# Patient Record
Sex: Female | Born: 1935 | Race: White | Hispanic: No | Smoking: Never smoker
Health system: Southern US, Community
[De-identification: ages and names within clinical notes are randomized; demographics above are authoritative.]

## PROBLEM LIST (undated history)

## (undated) DIAGNOSIS — M810 Age-related osteoporosis without current pathological fracture: Secondary | ICD-10-CM

## (undated) DIAGNOSIS — R32 Unspecified urinary incontinence: Secondary | ICD-10-CM

## (undated) DIAGNOSIS — I2699 Other pulmonary embolism without acute cor pulmonale: Secondary | ICD-10-CM

## (undated) DIAGNOSIS — K922 Gastrointestinal hemorrhage, unspecified: Secondary | ICD-10-CM

## (undated) DIAGNOSIS — D7289 Other specified disorders of white blood cells: Secondary | ICD-10-CM

## (undated) DIAGNOSIS — F0280 Dementia in other diseases classified elsewhere without behavioral disturbance: Secondary | ICD-10-CM

## (undated) DIAGNOSIS — R413 Other amnesia: Secondary | ICD-10-CM

## (undated) DIAGNOSIS — I509 Heart failure, unspecified: Secondary | ICD-10-CM

## (undated) DIAGNOSIS — E7800 Pure hypercholesterolemia, unspecified: Secondary | ICD-10-CM

## (undated) DIAGNOSIS — E875 Hyperkalemia: Secondary | ICD-10-CM

## (undated) DIAGNOSIS — F0390 Unspecified dementia without behavioral disturbance: Secondary | ICD-10-CM

## (undated) DIAGNOSIS — I82409 Acute embolism and thrombosis of unspecified deep veins of unspecified lower extremity: Secondary | ICD-10-CM

## (undated) DIAGNOSIS — M273 Alveolitis of jaws: Secondary | ICD-10-CM

## (undated) DIAGNOSIS — R350 Frequency of micturition: Secondary | ICD-10-CM

## (undated) DIAGNOSIS — L03039 Cellulitis of unspecified toe: Secondary | ICD-10-CM

## (undated) DIAGNOSIS — M1990 Unspecified osteoarthritis, unspecified site: Secondary | ICD-10-CM

## (undated) DIAGNOSIS — D473 Essential (hemorrhagic) thrombocythemia: Secondary | ICD-10-CM

## (undated) DIAGNOSIS — R269 Unspecified abnormalities of gait and mobility: Secondary | ICD-10-CM

## (undated) DIAGNOSIS — I10 Essential (primary) hypertension: Secondary | ICD-10-CM

## (undated) DIAGNOSIS — G309 Alzheimer's disease, unspecified: Secondary | ICD-10-CM

## (undated) DIAGNOSIS — R0602 Shortness of breath: Secondary | ICD-10-CM

## (undated) HISTORY — DX: Frequency of micturition: R35.0

## (undated) HISTORY — DX: Essential (hemorrhagic) thrombocythemia: D47.3

## (undated) HISTORY — DX: Alveolitis of jaws: M27.3

## (undated) HISTORY — DX: Other amnesia: R41.3

## (undated) HISTORY — DX: Alzheimer's disease, unspecified: G30.9

## (undated) HISTORY — DX: Dementia in other diseases classified elsewhere, unspecified severity, without behavioral disturbance, psychotic disturbance, mood disturbance, and anxiety: F02.80

## (undated) HISTORY — DX: Unspecified urinary incontinence: R32

## (undated) HISTORY — PX: APPENDECTOMY: SHX54

## (undated) HISTORY — DX: Unspecified osteoarthritis, unspecified site: M19.90

## (undated) HISTORY — DX: Heart failure, unspecified: I50.9

## (undated) HISTORY — DX: Cellulitis of unspecified toe: L03.039

## (undated) HISTORY — DX: Hyperkalemia: E87.5

## (undated) HISTORY — DX: Age-related osteoporosis without current pathological fracture: M81.0

## (undated) HISTORY — DX: Unspecified abnormalities of gait and mobility: R26.9

## (undated) HISTORY — DX: Other specified disorders of white blood cells: D72.89

## (undated) HISTORY — PX: CATARACT EXTRACTION W/ INTRAOCULAR LENS  IMPLANT, BILATERAL: SHX1307

## (undated) HISTORY — DX: Gastrointestinal hemorrhage, unspecified: K92.2

---

## 2006-02-17 ENCOUNTER — Emergency Department (HOSPITAL_COMMUNITY): Admission: EM | Admit: 2006-02-17 | Discharge: 2006-02-17 | Payer: Self-pay | Admitting: Emergency Medicine

## 2009-07-29 LAB — HM DEXA SCAN

## 2010-01-04 ENCOUNTER — Encounter: Admission: RE | Admit: 2010-01-04 | Discharge: 2010-01-04 | Payer: Self-pay | Admitting: Internal Medicine

## 2010-01-11 ENCOUNTER — Encounter: Admission: RE | Admit: 2010-01-11 | Discharge: 2010-01-11 | Payer: Self-pay | Admitting: Internal Medicine

## 2010-01-19 ENCOUNTER — Encounter (HOSPITAL_BASED_OUTPATIENT_CLINIC_OR_DEPARTMENT_OTHER): Admission: RE | Admit: 2010-01-19 | Discharge: 2010-03-15 | Payer: Self-pay | Admitting: Internal Medicine

## 2010-01-26 ENCOUNTER — Ambulatory Visit: Payer: Self-pay | Admitting: Vascular Surgery

## 2010-05-09 ENCOUNTER — Emergency Department (HOSPITAL_COMMUNITY): Admission: EM | Admit: 2010-05-09 | Discharge: 2010-05-09 | Payer: Self-pay | Admitting: Emergency Medicine

## 2010-09-13 LAB — URINALYSIS, ROUTINE W REFLEX MICROSCOPIC
Glucose, UA: NEGATIVE mg/dL
Hgb urine dipstick: NEGATIVE
Specific Gravity, Urine: 1.023 (ref 1.005–1.030)
Urobilinogen, UA: 0.2 mg/dL (ref 0.0–1.0)
pH: 5.5 (ref 5.0–8.0)

## 2010-09-13 LAB — DIFFERENTIAL
Basophils Relative: 0 % (ref 0–1)
Eosinophils Relative: 0 % (ref 0–5)
Lymphocytes Relative: 9 % — ABNORMAL LOW (ref 12–46)
Lymphs Abs: 1 10*3/uL (ref 0.7–4.0)
Monocytes Relative: 6 % (ref 3–12)
Neutro Abs: 9.3 10*3/uL — ABNORMAL HIGH (ref 1.7–7.7)

## 2010-09-13 LAB — BASIC METABOLIC PANEL
CO2: 28 mEq/L (ref 19–32)
Chloride: 103 mEq/L (ref 96–112)
GFR calc Af Amer: >60 mL/min (ref >60)
Glucose, Bld: 91 mg/dL (ref 70–99)
Sodium: 141 mEq/L (ref 135–145)

## 2010-09-13 LAB — CBC
Hemoglobin: 15.9 g/dL — ABNORMAL HIGH (ref 12.0–15.0)
MCH: 30.7 pg (ref 26.0–34.0)
MCHC: 33.3 g/dL (ref 30.0–36.0)
MCV: 92.2 fL (ref 78.0–100.0)
RBC: 5.16 MIL/uL — ABNORMAL HIGH (ref 3.87–5.11)

## 2010-11-15 NOTE — Procedures (Signed)
DUPLEX DEEP VENOUS EXAM - LOWER EXTREMITY   INDICATION:  Nonhealing wound.   HISTORY:  Edema:  At ankles.  Trauma/Surgery:  No.  Pain:  No.  PE:  No.  Previous DVT:  No.  Anticoagulants:  No.  Other:   DUPLEX EXAM:                CFV          SFV          PopV  PTV   GSV                R     L      R     L      R  L  R  L  R  L  Thrombosis    o     o      o     o      o  o  o  o  o  o  Spontaneous   +     +      +     +      +  +  +  +  +  +  Phasic        +     +      +     +      +  +  +  +  +  +  Augmentation  +     +      +     +      +  +  +  +  +  +  Compressible  +     +      +     +      +  +  +  +  +  +  Competent     Mildly d     Mildly d     Mildly d Mildly d + +     +  +             +     +   Legend:  + - yes  o - no  p - partial  D - decreased   IMPRESSION:  1. Bilateral lower extremities appear to be negative for deep venous      thrombosis and superficial phlebitis.  2. Bilateral lower extremities suggest mild reflux in the common      femoral vein and superficial femoral vein.  3. Bilateral greater saphenous veins appear to be negative for reflux.  4. Small pocket of fluid noted in the right popliteal fossa.  5. Somewhat limited due to patient's inability to follow instructions      completely.    _____________________________  Janetta Hora Fields, MD   NT/MEDQ  D:  01/26/2010  T:  01/26/2010  Job:  [id number]

## 2010-12-30 ENCOUNTER — Encounter: Payer: Self-pay | Admitting: Oncology

## 2012-12-01 DIAGNOSIS — I82409 Acute embolism and thrombosis of unspecified deep veins of unspecified lower extremity: Secondary | ICD-10-CM

## 2012-12-01 DIAGNOSIS — I2699 Other pulmonary embolism without acute cor pulmonale: Secondary | ICD-10-CM

## 2012-12-01 HISTORY — DX: Other pulmonary embolism without acute cor pulmonale: I26.99

## 2012-12-01 HISTORY — DX: Acute embolism and thrombosis of unspecified deep veins of unspecified lower extremity: I82.409

## 2012-12-04 ENCOUNTER — Emergency Department (HOSPITAL_COMMUNITY): Payer: Medicare Other

## 2012-12-04 ENCOUNTER — Inpatient Hospital Stay (HOSPITAL_COMMUNITY)
Admission: EM | Admit: 2012-12-04 | Discharge: 2012-12-07 | DRG: 176 | Disposition: A | Discharge status: Home Health | Payer: Medicare Other | Attending: Internal Medicine | Admitting: Internal Medicine

## 2012-12-04 ENCOUNTER — Encounter (HOSPITAL_COMMUNITY): Payer: Self-pay | Admitting: *Deleted

## 2012-12-04 DIAGNOSIS — F0390 Unspecified dementia without behavioral disturbance: Secondary | ICD-10-CM

## 2012-12-04 DIAGNOSIS — I2699 Other pulmonary embolism without acute cor pulmonale: Principal | ICD-10-CM

## 2012-12-04 DIAGNOSIS — IMO0002 Reserved for concepts with insufficient information to code with codable children: Secondary | ICD-10-CM

## 2012-12-04 DIAGNOSIS — R600 Localized edema: Secondary | ICD-10-CM

## 2012-12-04 DIAGNOSIS — G309 Alzheimer's disease, unspecified: Secondary | ICD-10-CM | POA: Diagnosis present

## 2012-12-04 DIAGNOSIS — I2789 Other specified pulmonary heart diseases: Secondary | ICD-10-CM | POA: Diagnosis present

## 2012-12-04 DIAGNOSIS — I824Z9 Acute embolism and thrombosis of unspecified deep veins of unspecified distal lower extremity: Secondary | ICD-10-CM | POA: Diagnosis present

## 2012-12-04 DIAGNOSIS — Z7982 Long term (current) use of aspirin: Secondary | ICD-10-CM

## 2012-12-04 DIAGNOSIS — Z7901 Long term (current) use of anticoagulants: Secondary | ICD-10-CM

## 2012-12-04 DIAGNOSIS — F0280 Dementia in other diseases classified elsewhere without behavioral disturbance: Secondary | ICD-10-CM | POA: Diagnosis present

## 2012-12-04 DIAGNOSIS — D72829 Elevated white blood cell count, unspecified: Secondary | ICD-10-CM

## 2012-12-04 DIAGNOSIS — Z823 Family history of stroke: Secondary | ICD-10-CM

## 2012-12-04 DIAGNOSIS — R7309 Other abnormal glucose: Secondary | ICD-10-CM | POA: Diagnosis present

## 2012-12-04 DIAGNOSIS — I359 Nonrheumatic aortic valve disorder, unspecified: Secondary | ICD-10-CM | POA: Diagnosis present

## 2012-12-04 DIAGNOSIS — I10 Essential (primary) hypertension: Secondary | ICD-10-CM

## 2012-12-04 DIAGNOSIS — E441 Mild protein-calorie malnutrition: Secondary | ICD-10-CM | POA: Diagnosis present

## 2012-12-04 DIAGNOSIS — L03119 Cellulitis of unspecified part of limb: Secondary | ICD-10-CM | POA: Diagnosis present

## 2012-12-04 DIAGNOSIS — Z66 Do not resuscitate: Secondary | ICD-10-CM | POA: Diagnosis present

## 2012-12-04 DIAGNOSIS — Z9089 Acquired absence of other organs: Secondary | ICD-10-CM

## 2012-12-04 DIAGNOSIS — R748 Abnormal levels of other serum enzymes: Secondary | ICD-10-CM

## 2012-12-04 DIAGNOSIS — Z79899 Other long term (current) drug therapy: Secondary | ICD-10-CM

## 2012-12-04 DIAGNOSIS — R55 Syncope and collapse: Secondary | ICD-10-CM

## 2012-12-04 DIAGNOSIS — E7800 Pure hypercholesterolemia, unspecified: Secondary | ICD-10-CM | POA: Diagnosis present

## 2012-12-04 DIAGNOSIS — L02419 Cutaneous abscess of limb, unspecified: Secondary | ICD-10-CM | POA: Diagnosis present

## 2012-12-04 HISTORY — DX: Unspecified dementia, unspecified severity, without behavioral disturbance, psychotic disturbance, mood disturbance, and anxiety: F03.90

## 2012-12-04 HISTORY — DX: Essential (primary) hypertension: I10

## 2012-12-04 HISTORY — DX: Pure hypercholesterolemia, unspecified: E78.00

## 2012-12-04 LAB — POCT I-STAT 3, ART BLOOD GAS (G3+)
Bicarbonate: 23.7 mEq/L (ref 20.0–24.0)
TCO2: 25 mmol/L (ref 0–100)

## 2012-12-04 LAB — CBC WITH DIFFERENTIAL/PLATELET
Basophils Relative: 0 % (ref 0–1)
Eosinophils Relative: 0 % (ref 0–5)
HCT: 45 % (ref 36.0–46.0)
Hemoglobin: 14.4 g/dL (ref 12.0–15.0)
Lymphs Abs: 0.8 10*3/uL (ref 0.7–4.0)
MCV: 80.2 fL (ref 78.0–100.0)
Monocytes Relative: 5 % (ref 3–12)
Platelets: 344 10*3/uL (ref 150–400)
RBC: 5.61 MIL/uL — ABNORMAL HIGH (ref 3.87–5.11)
WBC: 20.9 10*3/uL — ABNORMAL HIGH (ref 4.0–10.5)

## 2012-12-04 LAB — URINALYSIS, ROUTINE W REFLEX MICROSCOPIC
Glucose, UA: NEGATIVE mg/dL
Hgb urine dipstick: NEGATIVE
Ketones, ur: 15 mg/dL — AB
Protein, ur: 100 mg/dL — AB

## 2012-12-04 LAB — COMPREHENSIVE METABOLIC PANEL
AST: 45 U/L — ABNORMAL HIGH (ref 0–37)
Albumin: 3 g/dL — ABNORMAL LOW (ref 3.5–5.2)
Alkaline Phosphatase: 113 U/L (ref 39–117)
BUN: 27 mg/dL — ABNORMAL HIGH (ref 6–23)
Chloride: 102 mEq/L (ref 96–112)
Potassium: 4.3 mEq/L (ref 3.5–5.1)
Total Bilirubin: 0.6 mg/dL (ref 0.3–1.2)

## 2012-12-04 LAB — URINE MICROSCOPIC-ADD ON

## 2012-12-04 MED ORDER — SODIUM CHLORIDE 0.9 % IV BOLUS (SEPSIS)
1000.0000 mL | Freq: Once | INTRAVENOUS | Status: AC
  Administered 2012-12-04: 1000 mL via INTRAVENOUS

## 2012-12-04 MED ORDER — ASPIRIN 81 MG PO CHEW
324.0000 mg | CHEWABLE_TABLET | Freq: Once | ORAL | Status: AC
  Administered 2012-12-04: 324 mg via ORAL
  Filled 2012-12-04: qty 4

## 2012-12-04 MED ORDER — IOHEXOL 350 MG/ML SOLN
100.0000 mL | Freq: Once | INTRAVENOUS | Status: AC | PRN
  Administered 2012-12-04: 100 mL via INTRAVENOUS

## 2012-12-04 NOTE — ED Provider Notes (Signed)
History     CSN: [id number]  Arrival date & time 12/04/12  1955   First MD Initiated Contact with Patient 12/04/12 2001      No chief complaint on file.   (Consider location/radiation/quality/duration/timing/severity/associated sxs/prior treatment) HPI Comments: 79 y.o. female who presents to the Er after she had one episode of syncope at home. Per family, she was standing and then, "passed out" for a few seconds and then was back to baseline. Pt has significant alzheimer. Her daughter states she has noticed that her breathing has been slightly high as well.   Patient is a 79 y.o. female presenting with general illness. The history is provided by the EMS personnel and a caregiver. The history is limited by the condition of the patient.  Illness Severity:  Mild Onset quality:  Gradual Timing:  Constant Chronicity:  New Associated symptoms: no abdominal pain and no chest pain     No past medical history on file.  No past surgical history on file.  No family history on file.  History  Substance Use Topics  . Smoking status: Not on file  . Smokeless tobacco: Not on file  . Alcohol Use: Not on file    OB History   No data available      Review of Systems  Unable to perform ROS: Dementia  Cardiovascular: Negative for chest pain.  Gastrointestinal: Negative for abdominal pain.    Allergies  Review of patient's allergies indicates not on file.  Home Medications  No current outpatient prescriptions on file.  BP 116/61  Pulse 111  Temp(Src) 99.9 F (37.7 C) (Rectal)  Resp 27  SpO2 98%  Physical Exam  Constitutional: She appears well-developed and well-nourished. No distress.  HENT:  Head: Normocephalic.  Eyes: Pupils are equal, round, and reactive to light.  Neck: Normal range of motion. No tracheal deviation present. No thyromegaly present.  Cardiovascular:  Tachycardia   Pulmonary/Chest: She has no wheezes.  Slightly elevated RR  Abdominal: She exhibits  no distension. There is no tenderness.  Musculoskeletal: She exhibits no edema and no tenderness.  Neurological:  Pt is able to follow commands, but does not know person / place / time  Skin: Skin is warm.    ED Course  Procedures (including critical care time)  Labs Reviewed  CBC WITH DIFFERENTIAL - Abnormal; Notable for the following:    WBC 20.9 (*)    RBC 5.61 (*)    MCH 25.7 (*)    RDW 19.6 (*)    Neutrophils Relative % 91 (*)    Lymphocytes Relative 4 (*)    Neutro Abs 19.1 (*)    All other components within normal limits  COMPREHENSIVE METABOLIC PANEL - Abnormal; Notable for the following:    Glucose, Bld 198 (*)    BUN 27 (*)    Albumin 3.0 (*)    AST 45 (*)    GFR calc non Af Amer 55 (*)    GFR calc Af Amer 64 (*)    All other components within normal limits  URINALYSIS, ROUTINE W REFLEX MICROSCOPIC - Abnormal; Notable for the following:    Color, Urine AMBER (*)    Bilirubin Urine SMALL (*)    Ketones, ur 15 (*)    Protein, ur 100 (*)    Leukocytes, UA TRACE (*)    All other components within normal limits  URINE MICROSCOPIC-ADD ON - Abnormal; Notable for the following:    Casts HYALINE CASTS (*)    All  other components within normal limits  POCT I-STAT 3, BLOOD GAS (G3+) - Abnormal; Notable for the following:    pCO2 arterial 34.1 (*)    All other components within normal limits  POCT I-STAT TROPONIN I - Abnormal; Notable for the following:    Troponin i, poc 0.18 (*)    All other components within normal limits  CULTURE, BLOOD (ROUTINE X 2)  CULTURE, BLOOD (ROUTINE X 2)  BLOOD GAS, ARTERIAL  CBC WITH DIFFERENTIAL  TROPONIN I   Dg Chest 2 View  12/04/2012   *RADIOLOGY REPORT*  Clinical Data: Dizziness, pneumonia  CHEST - 2 VIEW  Comparison: 02/17/2006 chest radiograph, CT same date 02/17/2006  Findings: Mild cardiomegaly noted with chronically prominent interstitial markings but no evidence for alveolar edema or focal pulmonary opacity.  No pleural  effusion.  No acute osseous finding. Stable mid thoracic compression deformity at T8.  IMPRESSION: Cardiomegaly with stable prominent interstitial markings but no acute finding or significant interval change.   Original Report Authenticated By: Christiana Pellant, M.D.    ECG shows sinus tachycardia, HR 111, no inverted T waves. Left axis.   MDM  Pt with syncope episode at home. She is tachycardic, istat trop shows slight elevation, will make sure this is correct by repeating in lab. She has slightly elevated RR. And she is on 2L [place], her sats were in the high 80s initially. She has leukocytosis, but no clear signs of infection in urine or chest x-ray. Will get CT PE study to further evaluate for possible PE.   CTA and CT head pending currently -- hospitalist team is admitting pt. Cards fellow was called about slight elevation in trop, suspect demand related, and they state not to start heparin but to instead trend enzymes.   1. Syncope           Josephyne Tarter Donette Larry, MD 12/04/12 2336

## 2012-12-04 NOTE — ED Notes (Addendum)
Pt BIB [facility] for near syncopal episode, SOB for "a couple days" and edema in LLE. Pt coming from home. Before transport pt O2 88% RA. PT 97% on 4L en route. Pt ambulated with drop in O2 sats. Pt awake, alert, oriented per her norm. Respiration equal, unlabored.

## 2012-12-04 NOTE — ED Notes (Signed)
Family at bedside. 

## 2012-12-04 NOTE — ED Notes (Signed)
In and out cath performed.  Sterile technique used during procedure.  Urine amber and cloud with odor.  Patient tolerated well.

## 2012-12-05 ENCOUNTER — Inpatient Hospital Stay (HOSPITAL_COMMUNITY): Payer: Medicare Other

## 2012-12-05 ENCOUNTER — Encounter (HOSPITAL_COMMUNITY): Payer: Self-pay | Admitting: Internal Medicine

## 2012-12-05 DIAGNOSIS — I2699 Other pulmonary embolism without acute cor pulmonale: Secondary | ICD-10-CM

## 2012-12-05 DIAGNOSIS — I10 Essential (primary) hypertension: Secondary | ICD-10-CM

## 2012-12-05 DIAGNOSIS — R748 Abnormal levels of other serum enzymes: Secondary | ICD-10-CM | POA: Diagnosis present

## 2012-12-05 DIAGNOSIS — D72829 Elevated white blood cell count, unspecified: Secondary | ICD-10-CM

## 2012-12-05 DIAGNOSIS — R609 Edema, unspecified: Secondary | ICD-10-CM

## 2012-12-05 DIAGNOSIS — M7989 Other specified soft tissue disorders: Secondary | ICD-10-CM

## 2012-12-05 DIAGNOSIS — R55 Syncope and collapse: Secondary | ICD-10-CM

## 2012-12-05 DIAGNOSIS — R600 Localized edema: Secondary | ICD-10-CM | POA: Diagnosis present

## 2012-12-05 DIAGNOSIS — F0390 Unspecified dementia without behavioral disturbance: Secondary | ICD-10-CM | POA: Diagnosis present

## 2012-12-05 LAB — CBC WITH DIFFERENTIAL/PLATELET
Basophils Relative: 0 % (ref 0–1)
Eosinophils Absolute: 0 10*3/uL (ref 0.0–0.7)
HCT: 41.6 % (ref 36.0–46.0)
Hemoglobin: 13.3 g/dL (ref 12.0–15.0)
Lymphocytes Relative: 4 % — ABNORMAL LOW (ref 12–46)
Lymphs Abs: 0.7 10*3/uL (ref 0.7–4.0)
MCH: 25.4 pg — ABNORMAL LOW (ref 26.0–34.0)
MCHC: 32 g/dL (ref 30.0–36.0)
MCV: 79.4 fL (ref 78.0–100.0)
Monocytes Absolute: 1.1 10*3/uL — ABNORMAL HIGH (ref 0.1–1.0)
Neutro Abs: 16.8 10*3/uL — ABNORMAL HIGH (ref 1.7–7.7)
RDW: 19.6 % — ABNORMAL HIGH (ref 11.5–15.5)

## 2012-12-05 LAB — VALPROIC ACID LEVEL: Valproic Acid Lvl: <10 ug/mL — ABNORMAL LOW (ref 50.0–100.0)

## 2012-12-05 LAB — COMPREHENSIVE METABOLIC PANEL
ALT: 27 U/L (ref 0–35)
AST: 34 U/L (ref 0–37)
Albumin: 2.7 g/dL — ABNORMAL LOW (ref 3.5–5.2)
Alkaline Phosphatase: 97 U/L (ref 39–117)
Calcium: 8.8 mg/dL (ref 8.4–10.5)
Potassium: 4 mEq/L (ref 3.5–5.1)
Sodium: 142 mEq/L (ref 135–145)
Total Protein: 6.3 g/dL (ref 6.0–8.3)

## 2012-12-05 LAB — TROPONIN I
Troponin I: 0.46 ng/mL (ref <0.30)
Troponin I: 0.53 ng/mL (ref <0.30)

## 2012-12-05 LAB — HEPARIN LEVEL (UNFRACTIONATED): Heparin Unfractionated: <0.1 IU/mL — ABNORMAL LOW (ref 0.30–0.70)

## 2012-12-05 MED ORDER — DONEPEZIL HCL 10 MG PO TABS
10.0000 mg | ORAL_TABLET | Freq: Every day | ORAL | Status: DC
  Administered 2012-12-05 – 2012-12-06 (×2): 10 mg via ORAL
  Filled 2012-12-05 (×4): qty 1

## 2012-12-05 MED ORDER — ONDANSETRON HCL 4 MG PO TABS: 4.0000 mg | ORAL_TABLET | Freq: Four times a day (QID) | ORAL | Status: DC | PRN

## 2012-12-05 MED ORDER — HEPARIN (PORCINE) IN NACL 100-0.45 UNIT/ML-% IJ SOLN
1250.0000 [IU]/h | INTRAMUSCULAR | Status: AC
  Administered 2012-12-05: 1050 [IU]/h via INTRAVENOUS
  Administered 2012-12-05: 900 [IU]/h via INTRAVENOUS
  Administered 2012-12-05: 1050 [IU]/h via INTRAVENOUS
  Filled 2012-12-05 (×4): qty 250

## 2012-12-05 MED ORDER — ACETAMINOPHEN 650 MG RE SUPP: 650.0000 mg | Freq: Four times a day (QID) | RECTAL | Status: DC | PRN

## 2012-12-05 MED ORDER — SODIUM CHLORIDE 0.9 % IJ SOLN
3.0000 mL | Freq: Two times a day (BID) | INTRAMUSCULAR | Status: DC
  Administered 2012-12-05 – 2012-12-07 (×5): 3 mL via INTRAVENOUS

## 2012-12-05 MED ORDER — MEMANTINE HCL 10 MG PO TABS
10.0000 mg | ORAL_TABLET | Freq: Two times a day (BID) | ORAL | Status: DC
  Administered 2012-12-05 – 2012-12-07 (×5): 10 mg via ORAL
  Filled 2012-12-05 (×6): qty 1

## 2012-12-05 MED ORDER — ENSURE COMPLETE PO LIQD: 237.0000 mL | Freq: Every day | ORAL | Status: DC | PRN

## 2012-12-05 MED ORDER — AMLODIPINE BESYLATE 5 MG PO TABS
5.0000 mg | ORAL_TABLET | Freq: Every day | ORAL | Status: DC
  Administered 2012-12-05 – 2012-12-07 (×3): 5 mg via ORAL
  Filled 2012-12-05 (×3): qty 1

## 2012-12-05 MED ORDER — VANCOMYCIN HCL IN DEXTROSE 750-5 MG/150ML-% IV SOLN: 750.0000 mg | INTRAVENOUS | Status: DC

## 2012-12-05 MED ORDER — ACETAMINOPHEN 325 MG PO TABS: 650.0000 mg | ORAL_TABLET | Freq: Four times a day (QID) | ORAL | Status: DC | PRN

## 2012-12-05 MED ORDER — HEPARIN BOLUS VIA INFUSION
2000.0000 [IU] | Freq: Once | INTRAVENOUS | Status: AC
  Administered 2012-12-05: 2000 [IU] via INTRAVENOUS
  Filled 2012-12-05: qty 2000

## 2012-12-05 MED ORDER — SODIUM CHLORIDE 0.9 % IV SOLN
INTRAVENOUS | Status: AC
  Administered 2012-12-05 (×2): via INTRAVENOUS

## 2012-12-05 MED ORDER — ASPIRIN EC 325 MG PO TBEC
325.0000 mg | DELAYED_RELEASE_TABLET | Freq: Every day | ORAL | Status: DC
  Administered 2012-12-05 – 2012-12-07 (×3): 325 mg via ORAL
  Filled 2012-12-05 (×3): qty 1

## 2012-12-05 MED ORDER — DIVALPROEX SODIUM 125 MG PO CPSP
125.0000 mg | ORAL_CAPSULE | Freq: Every day | ORAL | Status: DC
  Administered 2012-12-05 – 2012-12-07 (×3): 125 mg via ORAL
  Filled 2012-12-05 (×3): qty 1

## 2012-12-05 MED ORDER — ONDANSETRON HCL 4 MG/2ML IJ SOLN: 4.0000 mg | Freq: Four times a day (QID) | INTRAMUSCULAR | Status: DC | PRN

## 2012-12-05 MED ORDER — HEPARIN BOLUS VIA INFUSION
3000.0000 [IU] | Freq: Once | INTRAVENOUS | Status: AC
  Administered 2012-12-05: 3000 [IU] via INTRAVENOUS

## 2012-12-05 MED ORDER — VANCOMYCIN HCL 10 G IV SOLR
1250.0000 mg | Freq: Once | INTRAVENOUS | Status: AC
  Administered 2012-12-05: 1250 mg via INTRAVENOUS
  Filled 2012-12-05: qty 1250

## 2012-12-05 NOTE — Progress Notes (Signed)
INITIAL NUTRITION ASSESSMENT  DOCUMENTATION CODES Per approved criteria  -Not Applicable   INTERVENTION:  Ensure Complete daily PRN (350 kcals, 13 gm protein per 8 fl oz bottle) RD to follow for nutrition care plan  NUTRITION DIAGNOSIS: Increased nutrient needs related to suspected malnutrition as evidenced by estimated nutrition needs  Goal: Oral intake with meals & supplements to meet >/= 90% of estimated nutrition needs  Monitor:  PO & supplemental intake, weight, labs, I/O's  Reason for Assessment: Malnutrition Screening Tool Report  79 y.o. female  Admitting Dx: bilateral pulmonary embolism  ASSESSMENT: Patient with underlying dementia and hypertension who lives at home with her daughter; on date of admission patient fell backwards onto the bed and had apparent loss of consciousness; CT angio of chest revealed bilateral pulmonary embolism.   RD spoke with daughter at bedside; reports her Mom eats well at home; ate almost all of her breakfast this AM (except for milk); patient's daughter suspects some progressive weight loss, however, unable to report quantity or time frame; patient with visible muscle loss to clavicles; would benefit from having nutritional supplements available as needed ---> RD to order.  RD suspects some level of malnutrition, however, unable to identify at this time.   Height: Ht Readings from Last 1 Encounters:  12/05/12 5\' 7"  (1.702 m)    Weight: Wt Readings from Last 1 Encounters:  12/05/12 125 lb 3.5 oz (56.8 kg)    Ideal Body Weight: 135 lb  % Ideal Body Weight: 92%  Wt Readings from Last 10 Encounters:  12/05/12 125 lb 3.5 oz (56.8 kg)    Usual Body Weight: ---  % Usual Body Weight: ---  BMI:  Body mass index is 19.61 kg/(m^2).  Estimated Nutritional Needs: Kcal: 1500-1700 Protein: 70-80 gm Fluid: </= 1.5 L  Skin: Intact  Diet Order: Cardiac  EDUCATION NEEDS: -No education needs identified at this  time   Intake/Output Summary (Last 24 hours) at 12/05/12 1425 Last data filed at 12/05/12 0600  Gross per 24 hour  Intake    436 ml  Output    150 ml  Net    286 ml    Last BM: 6/4  Labs:   Recent Labs Lab 12/04/12 2036 12/05/12 0330  NA 142 142  K 4.3 4.0  CL 102 106  CO2 25 26  BUN 27* 28*  CREATININE 0.89 0.83  CALCIUM 9.3 8.8  GLUCOSE 198* 128*    Scheduled Meds: . amLODipine  5 mg Oral Daily  . aspirin EC  325 mg Oral Daily  . divalproex  125 mg Oral Daily  . donepezil  10 mg Oral QHS  . memantine  10 mg Oral BID  . sodium chloride  3 mL Intravenous Q12H    Continuous Infusions: . sodium chloride 100 mL/hr at 12/05/12 0600  . heparin 1,050 Units/hr (12/05/12 1149)    Past Medical History  Diagnosis Date  . Dementia   . Hypertension   . High cholesterol     Past Surgical History  Procedure Laterality Date  . Appendectomy      Maureen Chatters, RD, LDN Pager #: [phone] After-Hours Pager #: [phone]

## 2012-12-05 NOTE — ED Provider Notes (Signed)
I saw and evaluated the patient, reviewed the resident's note and I agree with the findings and plan. The patient presents with complaints of passing out at home, difficulty breathing at home.  She has a history of severe dementia and adds little to the history due to this.  The history was taken from the daughter who is at bedside.    On exam, the vitals are stable and the patient is afebrile.  She is disoriented to place, time, and situation.  She denies to me she has any complaints.  Workup reveals a mild bump in troponin by istat and seems to be somewhat hypoxic requiring supplemental oxygen to maintain saturations.  There is an elevation of the wbc to 21k but no fever or obvious source of infection has been found.  A ct angio of the chest has been ordered and at this point is pending.  Medicine has been consulted as I feel as though she will require admission regardless of the results of the ct angio.    Geoffery Lyons, MD 12/05/12 [phone]

## 2012-12-05 NOTE — Progress Notes (Signed)
VASCULAR LAB PRELIMINARY  PRELIMINARY  PRELIMINARY  PRELIMINARY  Bilateral lower extremity venous duplex completed.    Preliminary report:  Right - No evidence of DVT, superficial thrombosis.or Baker's cyst.  Left - Positive for DVT coursing from the posterior tibial vein at the ankle through the popliteal and femoral veins. Also noted is a superficial thrombus of the lesser saphenous vein. There is no evidence of a Baker's cyst.  Hansel Devan, RVS 12/05/2012, 8:05 PM

## 2012-12-05 NOTE — Progress Notes (Signed)
  Echocardiogram 2D Echocardiogram has been performed.  Caroline Schroeder 12/05/2012, 10:13 AM

## 2012-12-05 NOTE — Progress Notes (Signed)
CONSULT NOTE - Initial Consult  Pharmacy Consult for Heparin and vancomycin Indication: PE, possible cellulitis  No Known Allergies  Patient Measurements: Height: 5\' 5"  (165.1 cm) Weight: 140 lb (63.504 kg) IBW/kg (Calculated) : 57  Vital Signs: Temp: 99.9 F (37.7 C) (06/04 2120) Temp src: Rectal (06/04 2120) BP: 105/67 mmHg (06/04 2245) Pulse Rate: 98 (06/04 2245)  Labs:  Recent Labs  12/04/12 2036 12/04/12 2211  HGB 14.4  --   HCT 45.0  --   PLT 344  --   CREATININE 0.89  --   TROPONINI  --  0.47*    Estimated Creatinine Clearance: 37.8 ml/min (by C-G formula based on Cr of 0.89).   Medical History: Past Medical History  Diagnosis Date  . Dementia   . Hypertension   . High cholesterol     Medications:  Norvasc  ASA  Oscal  Depakote  Aricept  Namenda  MVI  Assessment: 79 yo female with bilateral PE for heparin.   Also with lower extremity redness, swelling, possible cellulitis, for empiric antibiotics  Goal of Therapy:  Heparin level 0.3-0.7 units/ml Monitor platelets by anticoagulation protocol: Yes Vancomycin trough 10-15   Plan:  Heparin 3000 units IV bolus, then 900 units/hr Check heparin level in 6 hours.  Vancomycin 1250 mg IV now, then 750 mg IV q24h  Tianni Escamilla, Gary Fleet 12/05/2012,12:55 AM

## 2012-12-05 NOTE — Progress Notes (Signed)
Pt admitted into room 3303 with belongings. VSS. Heparin gtt infusing and rate and dose notified. Daughter is at bedside. CCMD and [facility] notified about transfer. Will continue to monitor.

## 2012-12-05 NOTE — H&P (Addendum)
[facility] History and Physical  [name] ZOX:[id number] DOB: Nov 14, 1920 DOA: 12/04/2012  Referring physician: ER physician. PCP: No primary provider on file. Immunologist care.  Chief Complaint: Loss of consciousness.  HPI: Caroline Schroeder is a 79 y.o. female with known history of dementia and hypertension had a brief spell of loss of consciousness at her house witnessed by patient's daughter. Patient's daughter went to call the patient for supper when patient was looking confused. She also had soiled her dress. She stood up to wear her coat when suddenly she fell backwards onto the bed and lost consciousness for less than a minute. EMS was called and patient was brought to the ER. Patient was found to be tachycardic with labs showing significant leukocytosis. There is no definite source found any infection. EKG was showing sinus tachycardia with cardiac enzyme showing mild positive troponins. By the time patient reached ER patient has become alert awake and back to her baseline. Patient's heart rate improved with fluids and rest. CT angiogram of the chest showed bilateral pulmonary embolism. Patient has been started heparin. As per patient's daughter patient has been experiencing left lower extremity swelling for last 2 weeks with some erythema. Patient also was occasionally noticed to have mild shortness of breath. Patient did not complain of any chest pain. Due to her positive troponins cardiology was consulted at this time they have advised to cycle cardiac markers.  Review of Systems: As presented in the history of presenting illness, rest negative.  Past Medical History  Diagnosis Date  . Dementia   . Hypertension   . High cholesterol    Past Surgical History  Procedure Laterality Date  . Appendectomy     Social History:  reports that she has never smoked. She does not have any smokeless tobacco history on file. She reports that  drinks alcohol. She reports that she does  not use illicit drugs. At home with her daughter. where does patient live-- Cannot do ADLs. Can patient participate in ADLs?  No Known Allergies  Family History  Problem Relation Age of Onset  . Stroke Brother       Prior to Admission medications   Medication Sig Start Date End Date Taking? Authorizing Provider  amLODipine (NORVASC) 5 MG tablet Take 5 mg by mouth daily.   Yes Historical Provider, MD  calcium-vitamin D (OSCAL WITH D) 500-200 MG-UNIT per tablet Take 1 tablet by mouth 2 (two) times daily.   Yes Historical Provider, MD  donepezil (ARICEPT) 10 MG tablet Take 10 mg by mouth at bedtime.   Yes Historical Provider, MD  memantine (NAMENDA) 10 MG tablet Take 10 mg by mouth 2 (two) times daily.   Yes Historical Provider, MD  Multiple Vitamin (MULTIVITAMIN WITH MINERALS) TABS Take 1 tablet by mouth daily.   Yes Historical Provider, MD  aspirin 81 MG chewable tablet Chew 81 mg by mouth daily.    Historical Provider, MD  divalproex (DEPAKOTE SPRINKLE) 125 MG capsule Take 125 mg by mouth daily.    Historical Provider, MD   Physical Exam: Filed Vitals:   12/04/12 2200 12/04/12 2215 12/04/12 2230 12/04/12 2245  BP: 111/62 108/65 106/70 105/67  Pulse: 102 105 106 98  Temp:      TempSrc:      Resp: 32 30 31 24   SpO2: 92% 90% 91% 93%     General:  Well-developed and nourished.  Eyes: Anicteric no pallor.  ENT: No discharge from the ears eyes nose mouth.  Neck: No  mass felt.  Cardiovascular: S1-S2 heard.  Respiratory: No rhonchi or crepitations.  Abdomen: Soft nontender bowel sounds present.  Skin: Erythema and swelling of the left lower extremity below-knee.  Musculoskeletal: Left lower extremity swelling.  Psychiatric: Alert awake oriented to her name.  Neurologic: Moves all extremities.  Labs on Admission:  Basic Metabolic Panel:  Recent Labs Lab 12/04/12 2036  NA 142  K 4.3  CL 102  CO2 25  GLUCOSE 198*  BUN 27*  CREATININE 0.89  CALCIUM 9.3    Liver Function Tests:  Recent Labs Lab 12/04/12 2036  AST 45*  ALT 33  ALKPHOS 113  BILITOT 0.6  PROT 6.8  ALBUMIN 3.0*   No results found for this basename: LIPASE, AMYLASE,  in the last 168 hours No results found for this basename: AMMONIA,  in the last 168 hours CBC:  Recent Labs Lab 12/04/12 2036  WBC 20.9*  NEUTROABS 19.1*  HGB 14.4  HCT 45.0  MCV 80.2  PLT 344   Cardiac Enzymes:  Recent Labs Lab 12/04/12 2211  TROPONINI 0.47*    BNP (last 3 results) No results found for this basename: PROBNP,  in the last 8760 hours CBG: No results found for this basename: GLUCAP,  in the last 168 hours  Radiological Exams on Admission: Dg Chest 2 View  12/04/2012   *RADIOLOGY REPORT*  Clinical Data: Dizziness, pneumonia  CHEST - 2 VIEW  Comparison: 02/17/2006 chest radiograph, CT same date 02/17/2006  Findings: Mild cardiomegaly noted with chronically prominent interstitial markings but no evidence for alveolar edema or focal pulmonary opacity.  No pleural effusion.  No acute osseous finding. Stable mid thoracic compression deformity at T8.  IMPRESSION: Cardiomegaly with stable prominent interstitial markings but no acute finding or significant interval change.   Original Report Authenticated By: Caroline Schroeder, M.D.   Ct Angio Chest W/cm &/or Wo Cm  12/05/2012   *RADIOLOGY REPORT*  Clinical Data: Syncope and shortness of breath  CT ANGIOGRAPHY CHEST  Technique:  Multidetector CT imaging of the chest using the standard protocol during bolus administration of intravenous contrast. Multiplanar reconstructed images including MIPs were obtained and reviewed to evaluate the vascular anatomy.  Contrast: OMNIPAQUE IOHEXOL 350 MG/ML SOLN  Comparison: Chest radiograph same date  Findings: The study is of adequate technical quality for evaluation for pulmonary embolism up to and including the 3rd order pulmonary arteries.  There are large bilateral main pulmonary emboli extending to  multiple distal segments including the right middle lobe, lingula, and lower lobes.  Cardiomegaly is noted.  There is mild straightening of the interventricular septum that could indicate right heart strain.  No pericardial effusion.  Moderate atheromatous aortic calcification noted without aneurysm. No lymphadenopathy.  Biapical presumed pleural thickening/scarring noted.  Dependent bibasilar atelectasis is noted.  There is also subpleural reticulation along the left upper lobe for example image 41, raising the question of any prior radiation therapy to the left breast or other cause of scarring.  No acute osseous abnormality. T8 compression fracture again noted. Remote sternal deformity redemonstrated.  IMPRESSION: Bilateral main pulmonary arterial emboli, overall large clot burden, with suggestion of right heart strain. Critical Value/emergent results were called by telephone at the time of interpretation on 12/05/2012 at 12:05 a.m. to Dr. Fonnie Jarvis, who verbally acknowledged these results.   Original Report Authenticated By: Caroline Schroeder, M.D.    EKG: Independently reviewed. Sinus tachycardia.  Assessment/Plan Principal Problem:   Pulmonary embolism Active Problems:   Dementia   HTN (hypertension)  Leucocytosis   1. Bilateral pulmonary embolism with right ventricular strain - patient is hemodynamically stable. Patient has been started on heparin infusion. If patient continues to be stable start oral anticoagulants. Check Doppler of the lower extremity. 2. Positive troponins probably from pulmonary embolism - cycle cardiac markers. 3. Leukocytosis - patient does have some erythema in the left lower extremity. Blood cultures has been sent for now I am placing patient on vancomycin for possible cellulitis of the left lower extremity. 4. Hypertension - continue home medications. 5. Dementia - continue home medications. 6. Hyperglycemia - check hemoglobin A1c.    Code Status: DO NOT RESUSCITATE  as confirmed with patient's daughter.  Family Communication: Patient's daughter at the bedside.  Disposition Plan: Admit to inpatient.    Kari Kerth N. [facility] Pager [phone].  If 7PM-7AM, please contact night-coverage [email or web address] Password [facility] 12/05/2012, 12:39 AM

## 2012-12-05 NOTE — Progress Notes (Signed)
Utilization review completed.  

## 2012-12-05 NOTE — Progress Notes (Signed)
CONSULT NOTE - Follow Up  Pharmacy Consult for Heparin  Indication: PE and DVT  No Known Allergies  Patient Measurements: Height: 5\' 7"  (170.2 cm) Weight: 125 lb 3.5 oz (56.8 kg) IBW/kg (Calculated) : 61.6  Vital Signs: Temp: 98.8 F (37.1 C) (06/05 2103) Temp src: Oral (06/05 2103) BP: 111/71 mmHg (06/05 2103) Pulse Rate: 103 (06/05 2103)  Labs:  Recent Labs  12/04/12 2036  12/05/12 0330 12/05/12 0806 12/05/12 1448 12/05/12 2143  HGB 14.4  --  13.3  --   --   --   HCT 45.0  --  41.6  --   --   --   PLT 344  --  309  --   --   --   HEPARINUNFRC  --   --   --  <0.10*  --  0.20*  CREATININE 0.89  --  0.83  --   --   --   TROPONINI  --   < > 0.46* 0.53* 0.38*  --   < > = values in this interval not displayed. Estimated Creatinine Clearance: 40.4 ml/min (by C-G formula based on Cr of 0.83).  Medical History: Past Medical History  Diagnosis Date  . Dementia   . Hypertension   . High cholesterol    Medications:  Norvasc  ASA  Oscal  Depakote  Aricept  Namenda  MVI  Assessment: 79 yo female with bilateral PE for heparin.    Her CBC is stable and she has no noted bleeding complications.  Heparin level is 0.2 on IV heparin rate of 1050 units/hr.  Her needs may be elevated given PE.  Goal of Therapy:  Heparin level 0.3-0.7 units/ml Monitor platelets by anticoagulation protocol: Yes   Plan:  Heparin 2000 unit bolus and increase drip to 1250 units/hr Check heparin level in 6 hours with AM labs.  Daily heparin level and CBC. Monitor closely for s/s of bleeding  Celedonio Miyamoto, PharmD, BCPS Clinical Pharmacist Pager [phone]  Thank you for allowing pharmacy to be part of this patients care team. 12/05/2012,10:17 PM

## 2012-12-05 NOTE — Progress Notes (Deleted)
.  VASCULAR LAB PRELIMINARY  ARTERIAL  ABI completed:    RIGHT    LEFT    PRESSURE WAVEFORM  PRESSURE WAVEFORM  BRACHIAL 142 Triphasic BRACHIAL 116 Biphasic  AT 72 Dampened Monophasic AT 59 Severely Dampened Monophasic  PT 73 Severely Dampened Monophasic PT 91 Dampened Monophasic    RIGHT LEFT  ABI 0.51 0.64   ABI on the right indicates a moderate to severe reduction in arterial flow. Left ABI indicates a moderate reduction in arterial flow. However abnormal Doppler waveforms would suggest a false elevation of pressures which would adversely effect the ABI results.   Smitty Ackerley, RVS 12/05/2012, 7:58 PM

## 2012-12-05 NOTE — Progress Notes (Signed)
[facility] Progress Note [place] TEAM 1 - Stepdown/ICU TEAM   Caroline Schroeder ZOX:[id number] DOB: 06-12-1921 DOA: 12/04/2012 PCP: No primary provider on file.  Brief narrative:  79 year old female with underlying dementia and hypertension who lives at home with her daughter. On date of admission daughter noted patient was more confused and had been incontinent. Upon assisting her mother up to take her to supper the patient fell backwards onto the bed and had apparent loss of consciousness for less than 1 minute. EMS was called. In the emergency department the patient was tachycardic with leukocytosis without any obvious sources of infection. EKG revealed sinus tachycardia. Cardiac enzymes were mildly elevated. After IV fluid hydration patient's tachycardia improved. CT angio of the chest revealed bilateral pulmonary embolism. The patient was started on heparin in the emergency department and after the above treatment measures have become awake and alert and apparently back to her baseline. According to patient's daughter they have noticed left lower extremity swelling for at least 2 weeks with some mild erythema and the patient is also noticed to have intermittent episodic shortness of breath. No apparent chest pain. Because of her positive troponins cardiology was consulted by the ER physician and the recommendation was to continue to cycle her cardiac markers.  Assessment/Plan: Active Problems:   Bilateral pulmonary embolism -stable on [place] oxygen -cont Heparin IV -transition to Xarelto vs Coumadin-- have aske CM to determine co pay for the novel agent -ECHO demonstrates pulmonary hypertension with moderate dilatation of right ventricle and moderate to severe dilatation right atrium-no prior echo for comparison but given no apparent history of comorbid conditions that would cause right-sided heart failure suspect all of this is secondary to acute pulmonary embolism    Elevation of cardiac  enzymes -due to PE    Severe aortic stenosis -Noted with murmur on exam -Critical stenosis due to severely thickened and calcified leaflets with a valve area of 0.39 cm square -Given advanced age would not be a surgical candidate and in setting of acute PE severe aortic stenosis likely contributed to her syncopal episode -Avoid dehydration in this patient    Leucocytosis -due to PE    Edema of left lower extremity -Venous duplex pending and highly suggestive of DVT- likely source for PE -erythema c/w stasis dermatitis so will d/c VANCOMYCIN    Dementia -cont Namenda and Aricept    HTN (hypertension) -cont Norvasc    Grade 1 diastolic dysfunction -seen on ECHO this admit -Compensated   DVT prophylaxis: Full dose anticoagulation with heparin Code Status: DO NOT RESUSCITATE Family Communication: Patient and daughter at bedside Disposition Plan: Transfer to telemetry Isolation: None Nutritional Status: Appears stable but given underlying dementia and advanced age likely has mild protein calorie malnutrition  Consultants: None  Procedures: 2-D echocardiogram   Left ventricle: The cavity size was normal. There was moderate concentric hypertrophy. Systolic function was normal. The estimated ejection fraction was in the range of 60% to 65%. Wall motion was normal; there were no regional wall motion abnormalities. Doppler parameters are consistent with abnormal left ventricular relaxation (grade 1 diastolic dysfunction). - Aortic valve: Trileaflet; severely thickened, severely calcified leaflets. Cusp separation was severely reduced. There was critical stenosis. Valve area: 0.39cm^2(VTI). Valve area: 0.42cm^2 (Vmax). - Mitral valve: Mildly to moderately calcified annulus. Mild regurgitation. - Left atrium: The atrium was mildly dilated. - Right ventricle: The cavity size was moderately dilated. Wall thickness was normal. Systolic function was mildly reduced. - Right  atrium: The atrium was moderately  to severely dilated. - Pulmonary arteries: Systolic pressure was moderately increased. PA peak pressure: 52mm Hg (S).   Lower extremity venous duplex  pending  Antibiotics: Vancomycin 6/4 >>> 6/5  HPI/Subjective: Patient alert and denies shortness of breath, chest pain or lower extremity discomfort   Objective: Blood pressure 122/73, pulse 95, temperature 98.8 F (37.1 C), temperature source Oral, resp. rate 25, height 5\' 7"  (1.702 m), weight 56.8 kg (125 lb 3.5 oz), SpO2 96.00%.  Intake/Output Summary (Last 24 hours) at 12/05/12 1322 Last data filed at 12/05/12 0600  Gross per 24 hour  Intake    436 ml  Output    150 ml  Net    286 ml     Exam: Follow up exam completed. Patient was admitted at 1239 this a.m.  Scheduled Meds: Scheduled Meds: . amLODipine  5 mg Oral Daily  . aspirin EC  325 mg Oral Daily  . divalproex  125 mg Oral Daily  . donepezil  10 mg Oral QHS  . memantine  10 mg Oral BID  . sodium chloride  3 mL Intravenous Q12H  . [START ON 12/07/2012] vancomycin  750 mg Intravenous Q24H   Continuous Infusions: . sodium chloride 100 mL/hr at 12/05/12 0600  . heparin 1,050 Units/hr (12/05/12 1149)    Data Reviewed: Basic Metabolic Panel:  Recent Labs Lab 12/04/12 2036 12/05/12 0330  NA 142 142  K 4.3 4.0  CL 102 106  CO2 25 26  GLUCOSE 198* 128*  BUN 27* 28*  CREATININE 0.89 0.83  CALCIUM 9.3 8.8   Liver Function Tests:  Recent Labs Lab 12/04/12 2036 12/05/12 0330  AST 45* 34  ALT 33 27  ALKPHOS 113 97  BILITOT 0.6 0.5  PROT 6.8 6.3  ALBUMIN 3.0* 2.7*   No results found for this basename: LIPASE, AMYLASE,  in the last 168 hours No results found for this basename: AMMONIA,  in the last 168 hours CBC:  Recent Labs Lab 12/04/12 2036 12/05/12 0330  WBC 20.9* 18.6*  NEUTROABS 19.1* 16.8*  HGB 14.4 13.3  HCT 45.0 41.6  MCV 80.2 79.4  PLT 344 309   Cardiac Enzymes:  Recent Labs Lab  12/04/12 2211 12/05/12 0330 12/05/12 0806  TROPONINI 0.47* 0.46* 0.53*   BNP (last 3 results) No results found for this basename: PROBNP,  in the last 8760 hours CBG: No results found for this basename: GLUCAP,  in the last 168 hours  Recent Results (from the past 240 hour(s))  MRSA PCR SCREENING     Status: None   Collection Time    12/05/12  3:43 AM      Result Value Range Status   MRSA by PCR NEGATIVE  NEGATIVE Final   Comment:            The GeneXpert MRSA Assay (FDA     approved for NASAL specimens     only), is one component of a     comprehensive MRSA colonization     surveillance program. It is not     intended to diagnose MRSA     infection nor to guide or     monitor treatment for     MRSA infections.     Studies:  Recent x-ray studies have been reviewed in detail by the Attending Physician  Scheduled Meds:  Reviewed in detail by the Attending Physician   Junious Silk, ANP [facility] Office  [phone] Pager [phone]  **If unable to reach the above provider after  paging please contact the Flow Manager @ [phone]  On-Call/Text Page:      [email or web address]      password [email or web address]  If 7PM-7AM, please contact night-coverage [email or web address] Password [email or web address] 12/05/2012, 1:22 PM   LOS: 1 day   I have examined the patient, reviewed the chart and modified the above note which I agree with.   Duane Trias,MD [phone] 12/05/2012, 5:18 PM

## 2012-12-05 NOTE — Progress Notes (Signed)
Rn received call about venous doppler on pt- positive on left leg from mid thigh to ankle for DVT.

## 2012-12-05 NOTE — Progress Notes (Signed)
Pt tx to 5500 per MD order, pt VSS, t BS and verbalized understanding of tx, report called to receiving RN all questions answered

## 2012-12-05 NOTE — Care Management Note (Addendum)
Page 1 of 2   12/06/2012     4:30:01 PM   CARE MANAGEMENT NOTE 12/06/2012  Patient:  Caroline Schroeder, Caroline Schroeder   Account Number:  [id number]  Date Initiated:  12/05/2012  Documentation initiated by:  Caroline Schroeder  Subjective/Objective Assessment:   Pt admitted with Syncope- AMS-  ?cellulitis, mildly elevated troponins--Bilateral pulmonary embolism with right ventricular strain     Action/Plan:   PTA pt lived at home with daughter- plan is to return home with daughter- NCM to follow for d/c needs  pt eval- rec hhpt.   Anticipated DC Date:  12/07/2012   Anticipated DC Plan:  HOME W HOME HEALTH SERVICES      DC Planning Services  CM consult      Caroline Schroeder Choice  HOME HEALTH   Choice offered to / List presented to:  C-4 Adult Children        HH arranged  HH-2 PT  HH-4 NURSE'S AIDE      HH agency  [facility]   Status of service:  Completed, signed off Medicare Important Message given?   (If response is "NO", the following Medicare IM given date fields will be blank) Date Medicare IM given:   Date Additional Medicare IM given:    Discharge Disposition:  HOME W HOME HEALTH SERVICES  Per UR Regulation:  Reviewed for med. necessity/level of care/duration of stay  If discussed at Long Length of Stay Meetings, dates discussed:    Comments:  12/06/12 15:30 Caroline Cape RN, BSN [phone] patient's co pay for xarelto will be $40-$45, patient will need a 30 day script for xarelto to take to the local pharmacy while she waits for the 90 day script to be filled by mail.  Daughter states she will go to the [facility] in [place] [address] to get this filled.  I will check to make sure they have xarelto available  [phone].  They do have [facility]to available at this [facility]- gave daughter xarelto discount card as well.   Per phsycal therapy recs hhpt, patient will also need an aide, daughter states they have had Caroline Schroeder before and would like to have Caroline Schroeder again. Referral made to Caroline Schroeder  notified, soc will begin 24-48 hrs post discharge.  Patient is for possible dc 12/07/12.  12/05/12- 1430- Caroline Pierini RN, BSN [phone] Spoke with pt and daughter at bedside regarding needs at home- per conversation pt lives with daughter has RW and shower chair at home. Per daughter pt functions for the most part independently with dressing and ADLs- the only issue is bathing that pt has refused to do on own or let daughter assist her with recently. Pt has had HH in the past but currently does not meet requirements for [facility]- discussed option of private pay assistance with daughter who states that they have used private pay in past and it would be an option that they could explore again for someone to come in to try to assist with bathing - list of private duty agencies given to pt's daughter. Referral for Xarelto received- benefits check submitted. NCM to cont. to follow- pt to tx to 5500 today 1530- update---MD- per benefits check on Xarelto- PER BLUE MEDICARE COPAY FOR XARELTO IS $45.00 IF THEY USE NETWORK PHARMACY AND IF THEY USE A PREFERRED PHARMACY IT WOULD BE $40/COPAY.  NO PRIOR AUTH REQUIRED. spoke with pt's daughter to inform her of pt's benefits per daughter pt uses mail order so could get initial 30 day  at local pharmacy then long term with mail order---pt uses mail order so would need both a 30 day script and a 90 day script at discharge----

## 2012-12-05 NOTE — Progress Notes (Signed)
CONSULT NOTE - Follow Up  Pharmacy Consult for Heparin  Indication: PE  No Known Allergies  Patient Measurements: Height: 5\' 7"  (170.2 cm) Weight: 125 lb 3.5 oz (56.8 kg) IBW/kg (Calculated) : 61.6  Vital Signs: Temp: 98.2 F (36.8 C) (06/05 0700) Temp src: Oral (06/05 0700) BP: 123/74 mmHg (06/05 0900) Pulse Rate: 101 (06/05 0900)  Labs:  Recent Labs  12/04/12 2036 12/04/12 2211 12/05/12 0330 12/05/12 0806  HGB 14.4  --  13.3  --   HCT 45.0  --  41.6  --   PLT 344  --  309  --   HEPARINUNFRC  --   --   --  <0.10*  CREATININE 0.89  --  0.83  --   TROPONINI  --  0.47* 0.46* 0.53*   Estimated Creatinine Clearance: 40.4 ml/min (by C-G formula based on Cr of 0.83).  Medical History: Past Medical History  Diagnosis Date  . Dementia   . Hypertension   . High cholesterol    Medications:  Norvasc  ASA  Oscal  Depakote  Aricept  Namenda  MVI  Assessment: 79 yo female with bilateral PE for heparin.   Her troponin level continues to increase - now at 0.53.  Her CBC is stable and she has no noted bleeding complications.  Heparin level is low this morning < 0.1 on IV heparin rate of 900 units/hr.  Her needs may be elevated given PE.  Goal of Therapy:  Heparin level 0.3-0.7 units/ml Monitor platelets by anticoagulation protocol: Yes   Plan:  Heparin 2000 units IV bolus, then 1050 units/hr Check heparin level in 6 hours. Monitor closely for s/s of bleeding  Nadara Mustard, PharmD., MS Clinical Pharmacist Pager:  [phone] Thank you for allowing pharmacy to be part of this patients care team. 12/05/2012,10:49 AM

## 2012-12-05 NOTE — ED Provider Notes (Signed)
Called from radiology with positive bilateral pulmonary embolism with right heart strain results and discussed with hospitalist who has assumed care for the patient. [phone]  Hurman Horn, MD 12/05/12 [phone]

## 2012-12-06 DIAGNOSIS — R748 Abnormal levels of other serum enzymes: Secondary | ICD-10-CM

## 2012-12-06 LAB — BASIC METABOLIC PANEL
BUN: 27 mg/dL — ABNORMAL HIGH (ref 6–23)
Chloride: 111 mEq/L (ref 96–112)
Creatinine, Ser: 0.8 mg/dL (ref 0.50–1.10)
GFR calc Af Amer: 73 mL/min — ABNORMAL LOW (ref >90)
Glucose, Bld: 163 mg/dL — ABNORMAL HIGH (ref 70–99)

## 2012-12-06 MED ORDER — RIVAROXABAN 20 MG PO TABS: 20.0000 mg | ORAL_TABLET | Freq: Every day | ORAL | Status: DC

## 2012-12-06 MED ORDER — RIVAROXABAN 15 MG PO TABS
15.0000 mg | ORAL_TABLET | Freq: Two times a day (BID) | ORAL | Status: DC
  Administered 2012-12-06 – 2012-12-07 (×3): 15 mg via ORAL
  Filled 2012-12-06 (×4): qty 1

## 2012-12-06 MED ORDER — RIVAROXABAN 15 MG PO TABS: 15.0000 mg | ORAL_TABLET | Freq: Two times a day (BID) | ORAL | Status: DC

## 2012-12-06 NOTE — Progress Notes (Signed)
CONSULT NOTE - Follow Up  Pharmacy Consult to change from IV Heparin to oral Xarelto  Indication: PE and DVT  No Known Allergies  Patient Measurements: Height: 5\' 7"  (170.2 cm) Weight: 125 lb 3.5 oz (56.8 kg) IBW/kg (Calculated) : 61.6  Vital Signs: Temp: 98.3 F (36.8 C) (06/06 0635) Temp src: Oral (06/06 0635) BP: 116/65 mmHg (06/06 0635) Pulse Rate: 93 (06/06 0635)  Labs:  Recent Labs  12/04/12 2036  12/05/12 0330 12/05/12 0806 12/05/12 1448 12/05/12 2143  HGB 14.4  --  13.3  --   --   --   HCT 45.0  --  41.6  --   --   --   PLT 344  --  309  --   --   --   HEPARINUNFRC  --   --   --  <0.10*  --  0.20*  CREATININE 0.89  --  0.83  --   --   --   TROPONINI  --   < > 0.46* 0.53* 0.38*  --   < > = values in this interval not displayed. Estimated Creatinine Clearance: 40.4 ml/min (by C-G formula based on Cr of 0.83).  Medical History: Past Medical History  Diagnosis Date  . Dementia   . Hypertension   . High cholesterol    Medications:  Norvasc  ASA  Oscal  Depakote  Aricept  Namenda  MVI  Assessment: To start oral Xarelto today in this 79 yo female with bilateral PE.    Her CBC was stable yesterday and she has no noted bleeding complications.  Scr = 0.83 and estimated CrCl ~ 40 ml/min.  If CrCl falls below 30 ml/min, Xarelto should be avoided.   Goal of Therapy:  Monitor platelets by anticoagulation protocol: Yes   Plan:  Discontinue IV Heparin drip at same time as  Xarelto initiation. Xarelto 15mg  po BID x 21 days to start this AM and DC the IV heparin drip.  After 21 days, adjust Xarelto dose to 20mg  once daily with supper.   Monitor closely for s/s of bleeding.  Monitor renal function.   Noah Delaine, RPh Clinical Pharmacist Pager: [phone]  Thank you for allowing pharmacy to be part of this patients care team. 12/06/2012,10:26 AM

## 2012-12-06 NOTE — Progress Notes (Signed)
Pt was combative and refused all lab work.

## 2012-12-06 NOTE — Progress Notes (Addendum)
[facility] PROGRESS NOTE  Caroline Schroeder WUJ:[id number] DOB: 1920/08/03 DOA: 12/04/2012 PCP: No primary provider on file.  Assessment/Plan:  1. Bilateral pulmonary embolism  -Stable on room air  -Continue Heparin IV  -Transition to Xarelto - approved by insurance. Daughter prefers Xarelto as it will be almost impossible to do frequent INR checks at the PCP's offfice, as patient has advanced dementia and does not do well outside of her familiar surroundings. Risk/benefits of coumadin vs. xarelto were discussed.Risks of long term anticoagulation including catastrophic falls, GI bleeds causing life threatening and life disabling effects. - Fall risk prevention discussed  -ECHO demonstrates pulmonary hypertension with moderate dilatation of right ventricle and moderate to severe dilatation right atrium-no prior echo for comparison but given no apparent history of comorbid conditions that would cause right-sided heart failure suspect all of this is secondary to acute pulmonary embolism   2. DVT - Left lower extremity - Continue anticoagulation therapy  3. Elevation of cardiac enzymes  -Secondary to PE-not a candidate for further work up. Doubt  4. Severe aortic stenosis  -Noted with murmur on exam  -Critical stenosis due to severely thickened and calcified leaflets with a valve area of 0.39 cm square  -Given advanced age would not be a surgical candidate and in setting of acute PE severe aortic stenosis likely contributed to her syncopal episode  -Avoid dehydration in this patient   Leucocytosis  -Secondary to to PE-afebrile and non toxic looking. UA negative for UTI, no PNA seen on CT Chest.  Dementia  -Continue Namenda and Aricept   HTN (hypertension)  -Continue Norvasc   Grade 1 diastolic dysfunction  -Seen on ECHO this admit  -Compensated  Discontinue telemetry monitoring Encouraged walking today   Code Status: DNR Family Communication: Daughter present at bedside.  Discussed condition and plan with her.  Disposition Plan: inpatient; likely discharge tomorrow   Consultants:  None at this time  Procedures:  None at this time  Antibiotics:  None at this time (indicate start date, and stop date if known)  HPI/Subjective: 79 yo WF with dementia presented for admission on 6/4 after 1 episode of loss of consciousness. She has no history of SOB, dizziness, recent falls. Patient was eating breakfast so her daughter reported the history. Pt lives with her daughter in a 2 story home. She does not use a walker or cane to get around, and has no difficulty navigating stairs. Daughter reports that pt likes to be more independent and has difficulty getting patient to doctors offices for appts. Pt denied abdominal pain and was having no difficulty eating.   Objective: Filed Vitals:   12/05/12 1200 12/05/12 1700 12/05/12 2103 12/06/12 0635  BP: 122/73 119/74 111/71 116/65  Pulse: 95 95 103 93  Temp:  98.6 F (37 C) 98.8 F (37.1 C) 98.3 F (36.8 C)  TempSrc:  Oral Oral Oral  Resp: 25 24 34 28  Height:      Weight:      SpO2: 96% 92% 95% 93%    Intake/Output Summary (Last 24 hours) at 12/06/12 1031 Last data filed at 12/05/12 2114  Gross per 24 hour  Intake 1235.28 ml  Output    150 ml  Net 1085.28 ml   Filed Weights   12/04/12 2300 12/05/12 0434  Weight: 63.504 kg (140 lb) 56.8 kg (125 lb 3.5 oz)    Exam:   General:  Alert, demented, no acute distress, sitting in bed eating breakfast  Cardiovascular: RRR, systolic murmur consistent with  aortic stenosis  Respiratory: clear to auscultations bilaterally, no accessory muscle use  Abdomen: soft, non-tender, non distended, +BS, no masses  Lower extremity: ROM/strength - normal, LLE - residual swelling and erythema thru the distal tibial area and dorsum of the foot consistent with DVT    Data Reviewed: Basic Metabolic Panel:  Recent Labs Lab 12/04/12 2036 12/05/12 0330  NA 142 142  K  4.3 4.0  CL 102 106  CO2 25 26  GLUCOSE 198* 128*  BUN 27* 28*  CREATININE 0.89 0.83  CALCIUM 9.3 8.8   Liver Function Tests:  Recent Labs Lab 12/04/12 2036 12/05/12 0330  AST 45* 34  ALT 33 27  ALKPHOS 113 97  BILITOT 0.6 0.5  PROT 6.8 6.3  ALBUMIN 3.0* 2.7*   CBC:  Recent Labs Lab 12/04/12 2036 12/05/12 0330  WBC 20.9* 18.6*  NEUTROABS 19.1* 16.8*  HGB 14.4 13.3  HCT 45.0 41.6  MCV 80.2 79.4  PLT 344 309   Cardiac Enzymes:  Recent Labs Lab 12/04/12 2211 12/05/12 0330 12/05/12 0806 12/05/12 1448  TROPONINI 0.47* 0.46* 0.53* 0.38*    Recent Results (from the past 240 hour(s))  CULTURE, BLOOD (ROUTINE X 2)     Status: None   Collection Time    12/04/12 10:52 PM      Result Value Range Status   Specimen Description BLOOD ARM LEFT   Final   Special Requests BOTTLES DRAWN AEROBIC ONLY 10CC   Final   Culture  Setup Time 12/05/2012 05:17   Final   Culture     Final   Value:        BLOOD CULTURE RECEIVED NO GROWTH TO DATE CULTURE WILL BE HELD FOR 5 DAYS BEFORE ISSUING A FINAL NEGATIVE REPORT   Report Status PENDING   Incomplete  CULTURE, BLOOD (ROUTINE X 2)     Status: None   Collection Time    12/04/12 10:54 PM      Result Value Range Status   Specimen Description BLOOD ARM LEFT   Final   Special Requests BOTTLES DRAWN AEROBIC ONLY 10CC   Final   Culture  Setup Time 12/05/2012 05:17   Final   Culture     Final   Value:        BLOOD CULTURE RECEIVED NO GROWTH TO DATE CULTURE WILL BE HELD FOR 5 DAYS BEFORE ISSUING A FINAL NEGATIVE REPORT   Report Status PENDING   Incomplete  MRSA PCR SCREENING     Status: None   Collection Time    12/05/12  3:43 AM      Result Value Range Status   MRSA by PCR NEGATIVE  NEGATIVE Final   Comment:            The GeneXpert MRSA Assay (FDA     approved for NASAL specimens     only), is one component of a     comprehensive MRSA colonization     surveillance program. It is not     intended to diagnose MRSA     infection  nor to guide or     monitor treatment for     MRSA infections.     Studies: Dg Chest 2 View  12/04/2012   *RADIOLOGY REPORT*  Clinical Data: Dizziness, pneumonia  CHEST - 2 VIEW  Comparison: 02/17/2006 chest radiograph, CT same date 02/17/2006  Findings: Mild cardiomegaly noted with chronically prominent interstitial markings but no evidence for alveolar edema or focal pulmonary opacity.  No pleural effusion.  No acute osseous finding. Stable mid thoracic compression deformity at T8.  IMPRESSION: Cardiomegaly with stable prominent interstitial markings but no acute finding or significant interval change.   Original Report Authenticated By: Christiana Pellant, M.D.   Ct Head Wo Contrast  12/05/2012   *RADIOLOGY REPORT*  Clinical Data: Syncope, pulmonary embolism  CT HEAD WITHOUT CONTRAST  Technique:  Contiguous axial images were obtained from the base of the skull through the vertex without contrast.  Comparison: 05/09/2010  Findings: Mild diffuse cortical volume loss noted with proportional ventricular prominence. No acute hemorrhage, acute infarction, or mass lesion is seen.  No midline shift.  The orbits and paranasal sinuses are intact.  No skull fracture.  IMPRESSION: No acute intracranial finding.   Original Report Authenticated By: Christiana Pellant, M.D.   Ct Angio Chest W/cm &/or Wo Cm  12/05/2012   *RADIOLOGY REPORT*  Clinical Data: Syncope and shortness of breath  CT ANGIOGRAPHY CHEST  Technique:  Multidetector CT imaging of the chest using the standard protocol during bolus administration of intravenous contrast. Multiplanar reconstructed images including MIPs were obtained and reviewed to evaluate the vascular anatomy.  Contrast: OMNIPAQUE IOHEXOL 350 MG/ML SOLN  Comparison: Chest radiograph same date  Findings: The study is of adequate technical quality for evaluation for pulmonary embolism up to and including the 3rd order pulmonary arteries.  There are large bilateral main pulmonary emboli  extending to multiple distal segments including the right middle lobe, lingula, and lower lobes.  Cardiomegaly is noted.  There is mild straightening of the interventricular septum that could indicate right heart strain.  No pericardial effusion.  Moderate atheromatous aortic calcification noted without aneurysm. No lymphadenopathy.  Biapical presumed pleural thickening/scarring noted.  Dependent bibasilar atelectasis is noted.  There is also subpleural reticulation along the left upper lobe for example image 41, raising the question of any prior radiation therapy to the left breast or other cause of scarring.  No acute osseous abnormality. T8 compression fracture again noted. Remote sternal deformity redemonstrated.  IMPRESSION: Bilateral main pulmonary arterial emboli, overall large clot burden, with suggestion of right heart strain. Critical Value/emergent results were called by telephone at the time of interpretation on 12/05/2012 at 12:05 a.m. to Dr. Fonnie Jarvis, who verbally acknowledged these results.   Original Report Authenticated By: Christiana Pellant, M.D.    Scheduled Meds: . amLODipine  5 mg Oral Daily  . aspirin EC  325 mg Oral Daily  . divalproex  125 mg Oral Daily  . donepezil  10 mg Oral QHS  . memantine  10 mg Oral BID  . [START ON 12/27/2012] rivaroxaban  20 mg Oral Q supper  . rivaroxaban  15 mg Oral BID  . sodium chloride  3 mL Intravenous Q12H    Active Problems:   Bilateral pulmonary embolism   Dementia   HTN (hypertension)   Leucocytosis   Edema of left lower extremity   Elevation of cardiac enzymes   Ralene Muskrat PA-S Algis Downs, PA-C [facility] Pager [phone] If 7PM-7AM, please contact night-coverage at [email or web address], password [facility] 12/06/2012, 10:31 AM  LOS: 2 days   Attending Patient seen and examined, agree with the assessment and plan. Long d/w daughter at bedside, clearly needs anticoagulation for atleast 3-6 months. Thankfully per daughter-no recent  falls-last fall was 2 years back. Coumadin vs Xarelto discussed in great detail, pros or cons discussed. Daughter feels that getting frequent INR checks will almost be impossible-given the fact that the patient has dementia and does not like  to leave her home, she chooses Xarelto. Ambulate today, hopefully home in am  S Ghimire

## 2012-12-06 NOTE — Progress Notes (Signed)
Pt refused vitals 

## 2012-12-06 NOTE — Evaluation (Signed)
Physical Therapy Evaluation Patient Details Name: Caroline Schroeder MRN: [id number] DOB: 11-17-1920 Today's Date: 12/06/2012 Time: 4098-1191 PT Time Calculation (min): 24 min  PT Assessment / Plan / Recommendation Clinical Impression  pt with baseline dementia and adm with DVT and bil PE; Will benefit from PT to address deficits below; Pt may D/C home Sat and her bedroom is upstairs at her dtr's home; Would be beneficial to practice a few stairs/amb again with therapy prior to D/C.    PT Assessment  Patient needs continued PT services    Follow Up Recommendations  Home health PT;Supervision/Assistance - 24 hour    Does the patient have the potential to tolerate intense rehabilitation      Barriers to Discharge None      Equipment Recommendations  Rolling walker with 5" wheels    Recommendations for Other Services     Frequency Min 3X/week    Precautions / Restrictions Precautions Precautions: Fall   Pertinent Vitals/Pain Denies pain; VSS      Mobility  Bed Mobility Bed Mobility: Supine to Sit;Sit to Supine;Scooting to [facility];Sitting - Scoot to Edge of Bed Supine to Sit: 4: Min assist Sitting - Scoot to [place] of Bed: 4: Min assist Sit to Supine: 4: Min assist;4: Min guard;HOB flat Scooting to [facility]: With rail;4: Min assist Details for Bed Mobility Assistance: pt requiring assist with UB to come to  sitting position, LEs to supine, increased time and facilitation to scoot in sitting, LE support to scoot to [facility]; multi-modal cues for all tasks (inititation adn sequence) Transfers Transfers: Sit to Stand;Stand to Sit Sit to Stand: 4: Min assist;3: Mod assist Stand to Sit: 4: Min assist Details for Transfer Assistance: multi-modal cues for hand placement, wt shift, safety; requires assist to stabilize once standing and to control descent Ambulation/Gait Ambulation/Gait Assistance: 4: Min assist;3: Mod assist Ambulation Distance (Feet): 60 Feet Assistive device: Rolling walker;1  person hand held assist Ambulation/Gait Assistance Details: pt requires mod assist for amb without Ad (pt does not use Ad at baseline); min/mod with Rw  with decreased assist required with increased distance Gait Pattern: Narrow base of support;Shuffle;Decreased stride length Gait velocity: decr    Exercises     PT Diagnosis: Difficulty walking  PT Problem List: Decreased activity tolerance;Decreased balance;Decreased cognition;Decreased knowledge of use of DME;Decreased mobility PT Treatment Interventions: DME instruction;Gait training;Stair training;Functional mobility training;Therapeutic activities;Balance training   PT Goals Acute Rehab PT Goals PT Goal Formulation: With family Time For Goal Achievement: 12/13/12 Potential to Achieve Goals: Good Pt will go Supine/Side to Sit: with supervision PT Goal: Supine/Side to Sit - Progress: Goal set today Pt will go Sit to Stand: with supervision PT Goal: Sit to Stand - Progress: Goal set today Pt will Ambulate: 51 - 150 feet;with supervision;with min assist;with least restrictive assistive device PT Goal: Ambulate - Progress: Goal set today Pt will Go Up / Down Stairs: 3-5 stairs;with min assist;with rail(s) PT Goal: Up/Down Stairs - Progress: Goal set today  Visit Information  Last PT Received On: 12/06/12 Assistance Needed: +1    Subjective Data  Subjective: pt smiles Patient Stated Goal: home per dtr   Prior Functioning  Home Living Lives With: Daughter Available Help at Discharge: Family;Available 24 hours/day Type of Home: House Home Layout: Two level;Bed/bath upstairs Alternate Level Stairs-Number of Steps: flight  Alternate Level Stairs-Rails: Right Bathroom Toilet: Standard Home Adaptive Equipment: None Additional Comments: dtr is supportive and can assist as needed; pt weaker than her baseline  Prior Function  Comments: pt was I with mobility; opposed to bathing per dtr (dementia  component) Communication Communication: No difficulties    Cognition  Cognition Arousal/Alertness: Awake/alert Behavior During Therapy: WFL for tasks assessed/performed Overall Cognitive Status: History of cognitive impairments - at baseline    Extremity/Trunk Assessment Right Upper Extremity Assessment RUE ROM/Strength/Tone: [facility] for tasks assessed Left Upper Extremity Assessment LUE ROM/Strength/Tone: [facility] for tasks assessed Right Lower Extremity Assessment RLE ROM/Strength/Tone: [facility] for tasks assessed Left Lower Extremity Assessment LLE ROM/Strength/Tone: WFL for tasks assessed   Balance Static Standing Balance Static Standing - Balance Support: No upper extremity supported Static Standing - Level of Assistance: 3: Mod assist (posterior LOB initially)  End of Session PT - End of Session Equipment Utilized During Treatment: Gait belt Activity Tolerance: Patient limited by fatigue Patient left: in bed;with call bell/phone within reach;with family/visitor present Nurse Communication: Mobility status  GP     [facility] 12/06/2012, 1:34 PM

## 2012-12-06 NOTE — Clinical Documentation Improvement (Signed)
DOCUMENTATION CLARIFICATION QUERY  THIS DOCUMENT IS NOT A PERMANENT PART OF THE MEDICAL RECORD  TO RESPOND TO THE THIS QUERY, FOLLOW THE INSTRUCTIONS BELOW:  1. If needed, update documentation for the patient's encounter via the notes activity.  2. Access this query again and click edit on the In [employer].  3. After updating, or not, click F2 to complete all highlighted (required) fields concerning your review. Select "additional documentation in the medical record" OR "no additional documentation provided".  4. Click Sign note button.  5. The deficiency will fall out of your In Basket *Please let us know if you are not able to complete this workflow by phone or e-mail (listed below).  Please update your documentation within the medical record to reflect your response to this query.                                                                                    12/06/12  To Stephani Police, PA-C / Associates,  In a better effort to capture your patient's severity of illness, reflect appropriate length of stay and utilization of resources, a review of the patient medical record has revealed the following indicators the diagnosis of Heart Failure.    Based on your clinical judgment, please clarify and document in a progress note and/or discharge summary the clinical condition associated with the following supporting information:  In responding to this query please exercise your independent judgment.  The fact that a query is asked, does not imply that any particular answer is desired or expected.  Documenting "Grade 1 diastolic dysfunction"  notes please clarify the diagnosis if possible.  Thank you   Possible Clinical Conditions?  Chronic Systolic Congestive Heart Failure  Chronic Diastolic Congestive Heart Failure  Acute Systolic Congestive Heart Failure  Acute Diastolic Congestive Heart Failure  Acute on Chronic Diastolic Congestive Heart Failure  Acute on Chronic  Systolic & Diastolic  Congestive Heart Failure  Other Condition  Cannot Clinically Determine  And  Right sided heart failure   Diagnostics: Echo   Monitored: Telemetry   Reviewed: no additional documentation   Thank Arrie [place]  Clinical Documentation Specialist: Pager [phone]  Health Information Management [place]

## 2012-12-07 DIAGNOSIS — F0390 Unspecified dementia without behavioral disturbance: Secondary | ICD-10-CM

## 2012-12-07 LAB — CBC
HCT: 39.7 % (ref 36.0–46.0)
Hemoglobin: 12.2 g/dL (ref 12.0–15.0)
MCH: 24.9 pg — ABNORMAL LOW (ref 26.0–34.0)
MCHC: 30.7 g/dL (ref 30.0–36.0)
MCV: 81.2 fL (ref 78.0–100.0)
RDW: 20.1 % — ABNORMAL HIGH (ref 11.5–15.5)

## 2012-12-07 MED ORDER — ENSURE COMPLETE PO LIQD: 237.0000 mL | Freq: Every day | ORAL | Status: DC | PRN

## 2012-12-07 NOTE — Progress Notes (Signed)
Physical Therapy Treatment Patient Details Name: Caroline Schroeder MRN: [id number] DOB: 1920/08/10 Today's Date: 12/07/2012 Time: 1191-4782 PT Time Calculation (min): 40 min  PT Assessment / Plan / Recommendation Comments on Treatment Session  Plan is for d/c home today with daughter.  Pt has a SW at home.  PT is recommending a RW.  Pt's daughter wants to confirm insurance will cover the RW before obtaining.    Follow Up Recommendations  Home health PT;Supervision/Assistance - 24 hour     Does the patient have the potential to tolerate intense rehabilitation     Barriers to Discharge        Equipment Recommendations  Rolling walker with 5" wheels    Recommendations for Other Services    Frequency Min 3X/week   Plan Discharge plan remains appropriate;Frequency remains appropriate    Precautions / Restrictions Precautions Precautions: Fall   Pertinent Vitals/Pain 0/10    Mobility  Bed Mobility Supine to Sit: 4: Min assist;HOB flat Sit to Supine: 4: Min assist;HOB flat Scooting to [facility]: 6: Modified independent (Device/Increase time) Details for Bed Mobility Assistance: verbal cues for sequencing Transfers Sit to Stand: 4: Min assist;From bed;With upper extremity assist;From chair/3-in-1 Stand to Sit: 4: Min guard;To bed;With upper extremity assist;To chair/3-in-1 Details for Transfer Assistance: verbal cues for hand placement Ambulation/Gait Ambulation/Gait Assistance: 4: Min assist Ambulation Distance (Feet): 100 Feet (x 2) Assistive device: Rolling walker Gait Pattern: Decreased stride length Gait velocity: decreased Stairs: Yes Stairs Assistance: 4: Min guard Stair Management Technique: Two rails;Alternating pattern Number of Stairs: 5    Exercises     PT Diagnosis:    PT Problem List:   PT Treatment Interventions:     PT Goals Acute Rehab PT Goals PT Goal: Supine/Side to Sit - Progress: Progressing toward goal PT Goal: Sit to Stand - Progress: Progressing  toward goal PT Goal: Ambulate - Progress: Progressing toward goal PT Goal: Up/Down Stairs - Progress: Met  Visit Information  Last PT Received On: 12/07/12 Assistance Needed: +1    Subjective Data  Subjective: Pt smiles. Patient Stated Goal: home per daughter   Cognition  Cognition Arousal/Alertness: Awake/alert Behavior During Therapy: WFL for tasks assessed/performed Overall Cognitive Status: History of cognitive impairments - at baseline    Balance     End of Session PT - End of Session Equipment Utilized During Treatment: Gait belt Activity Tolerance: Patient tolerated treatment well Patient left: in bed;with call bell/phone within reach;with family/visitor present Nurse Communication: Mobility status   GP     Ilda Foil 12/07/2012, 11:59 AM  Aida Raider, PT  Office # [phone] Pager [phone]

## 2012-12-07 NOTE — Care Management Note (Signed)
Caroline Schroeder rep Elta Guadeloupe notified Cm that Caroline Schroeder unable to provide services for pt. Pt discharged earlier in the day. Cm telephoned pt's residence and spoke  With pt's daughter to offer choice again for [facility]. Per pt and family choice [facility] to provide [facility] services. [facility] rep Marijean Niemann notified of pt referral at [phone]. No other needs identified.   Roxy Manns Lando Alcalde,RN,BSN [phone]

## 2012-12-07 NOTE — Progress Notes (Signed)
Patient discharge teaching given, including activity, diet, follow-up appoints, and medications. Patient verbalized understanding of all discharge instructions. IV access was d/c'd. Vitals are stable. Skin is intact except as charted in most recent assessments. Pt to be escorted out by NT, to be driven home by family. 

## 2012-12-07 NOTE — Discharge Summary (Signed)
PATIENT DETAILS Name: Caroline Schroeder Age: 79 y.o. Sex: female Date of Birth: 27-Mar-1921 MRN: [id number]. Admit Date: 12/04/2012 Admitting Physician: Eduard Clos, MD PCP:No primary provider on file.  Recommendations for Outpatient Follow-up:  1. Patient has been started on Xarelto will, please check renal function periodically.  PRIMARY DISCHARGE DIAGNOSIS:  Active Problems:   Bilateral pulmonary embolism   Dementia   HTN (hypertension)   Leucocytosis   Edema of left lower extremity   Elevation of cardiac enzymes      PAST MEDICAL HISTORY: Past Medical History  Diagnosis Date  . Dementia   . Hypertension   . High cholesterol     DISCHARGE MEDICATIONS:   Medication List    TAKE these medications       amLODipine 5 MG tablet  Commonly known as:  NORVASC  Take 5 mg by mouth daily.     aspirin 81 MG chewable tablet  Chew 81 mg by mouth daily.     calcium-vitamin D 500-200 MG-UNIT per tablet  Commonly known as:  OSCAL WITH D  Take 1 tablet by mouth 2 (two) times daily.     divalproex 125 MG capsule  Commonly known as:  DEPAKOTE SPRINKLE  Take 125 mg by mouth daily.     donepezil 10 MG tablet  Commonly known as:  ARICEPT  Take 10 mg by mouth at bedtime.     feeding supplement Liqd  Take 237 mLs by mouth daily as needed (suboptimal intake).     memantine 10 MG tablet  Commonly known as:  NAMENDA  Take 10 mg by mouth 2 (two) times daily.     multivitamin with minerals Tabs  Take 1 tablet by mouth daily.     Rivaroxaban 15 MG Tabs tablet  Commonly known as:  XARELTO  Take 1 tablet (15 mg total) by mouth 2 (two) times daily. Take twice daily till 12/26/12-from 12/27/12 take 20 mg once a day     Rivaroxaban 20 MG Tabs  Commonly known as:  XARELTO  Take 1 tablet (20 mg total) by mouth daily with supper. First dose on Fri 12/27/12  Start taking on:  12/27/2012       ALLERGIES:  No Known Allergies  BRIEF HPI:  See H&P, Labs, Consult and Test reports  for all details in brief, Caroline Schroeder is a 79 y.o. female with known history of dementia and hypertension had a brief spell of loss of consciousness at her house witnessed by patient's daughter. Patient's daughter went to call the patient for supper when patient was looking confused. She also had soiled her dress. She stood up to wear her coat when suddenly she fell backwards onto the bed and lost consciousness for less than a minute. EMS was called and patient was brought to the ER. Patient was found to be tachycardic with labs showing significant leukocytosis. There is no definite source found any infection. EKG was showing sinus tachycardia with cardiac enzyme showing mild positive troponins. By the time patient reached ER patient has become alert awake and back to her baseline. Patient's heart rate improved with fluids and rest. CT angiogram of the chest showed bilateral pulmonary embolism.  CONSULTATIONS:   None  PERTINENT RADIOLOGIC STUDIES: Dg Chest 2 View  12/04/2012   *RADIOLOGY REPORT*  Clinical Data: Dizziness, pneumonia  CHEST - 2 VIEW  Comparison: 02/17/2006 chest radiograph, CT same date 02/17/2006  Findings: Mild cardiomegaly noted with chronically prominent interstitial markings but no evidence for alveolar edema or  focal pulmonary opacity.  No pleural effusion.  No acute osseous finding. Stable mid thoracic compression deformity at T8.  IMPRESSION: Cardiomegaly with stable prominent interstitial markings but no acute finding or significant interval change.   Original Report Authenticated By: Christiana Pellant, M.D.   Ct Head Wo Contrast  12/05/2012   *RADIOLOGY REPORT*  Clinical Data: Syncope, pulmonary embolism  CT HEAD WITHOUT CONTRAST  Technique:  Contiguous axial images were obtained from the base of the skull through the vertex without contrast.  Comparison: 05/09/2010  Findings: Mild diffuse cortical volume loss noted with proportional ventricular prominence. No acute hemorrhage, acute  infarction, or mass lesion is seen.  No midline shift.  The orbits and paranasal sinuses are intact.  No skull fracture.  IMPRESSION: No acute intracranial finding.   Original Report Authenticated By: Christiana Pellant, M.D.   Ct Angio Chest W/cm &/or Wo Cm  12/05/2012   *RADIOLOGY REPORT*  Clinical Data: Syncope and shortness of breath  CT ANGIOGRAPHY CHEST  Technique:  Multidetector CT imaging of the chest using the standard protocol during bolus administration of intravenous contrast. Multiplanar reconstructed images including MIPs were obtained and reviewed to evaluate the vascular anatomy.  Contrast: OMNIPAQUE IOHEXOL 350 MG/ML SOLN  Comparison: Chest radiograph same date  Findings: The study is of adequate technical quality for evaluation for pulmonary embolism up to and including the 3rd order pulmonary arteries.  There are large bilateral main pulmonary emboli extending to multiple distal segments including the right middle lobe, lingula, and lower lobes.  Cardiomegaly is noted.  There is mild straightening of the interventricular septum that could indicate right heart strain.  No pericardial effusion.  Moderate atheromatous aortic calcification noted without aneurysm. No lymphadenopathy.  Biapical presumed pleural thickening/scarring noted.  Dependent bibasilar atelectasis is noted.  There is also subpleural reticulation along the left upper lobe for example image 41, raising the question of any prior radiation therapy to the left breast or other cause of scarring.  No acute osseous abnormality. T8 compression fracture again noted. Remote sternal deformity redemonstrated.  IMPRESSION: Bilateral main pulmonary arterial emboli, overall large clot burden, with suggestion of right heart strain. Critical Value/emergent results were called by telephone at the time of interpretation on 12/05/2012 at 12:05 a.m. to Dr. Fonnie Jarvis, who verbally acknowledged these results.   Original Report Authenticated By: Christiana Pellant, M.D.     PERTINENT LAB RESULTS: CBC:  Recent Labs  12/05/12 0330 12/07/12 0542  WBC 18.6* 13.4*  HGB 13.3 12.2  HCT 41.6 39.7  PLT 309 343   CMET CMP     Component Value Date/Time   NA 145 12/06/2012 0945   K 3.5 12/06/2012 0945   CL 111 12/06/2012 0945   CO2 22 12/06/2012 0945   GLUCOSE 163* 12/06/2012 0945   BUN 27* 12/06/2012 0945   CREATININE 0.80 12/06/2012 0945   CALCIUM 8.2* 12/06/2012 0945   PROT 6.3 12/05/2012 0330   ALBUMIN 2.7* 12/05/2012 0330   AST 34 12/05/2012 0330   ALT 27 12/05/2012 0330   ALKPHOS 97 12/05/2012 0330   BILITOT 0.5 12/05/2012 0330   GFRNONAA 63* 12/06/2012 0945   GFRAA 73* 12/06/2012 0945    GFR Estimated Creatinine Clearance: 41.9 ml/min (by C-G formula based on Cr of 0.8). No results found for this basename: LIPASE, AMYLASE,  in the last 72 hours  Recent Labs  12/05/12 0330 12/05/12 0806 12/05/12 1448  TROPONINI 0.46* 0.53* 0.38*   No components found with this basename: POCBNP,  No results found for this basename: DDIMER,  in the last 72 hours  Recent Labs  12/05/12 0330  HGBA1C 5.6   No results found for this basename: CHOL, HDL, LDLCALC, TRIG, CHOLHDL, LDLDIRECT,  in the last 72 hours No results found for this basename: TSH, T4TOTAL, FREET3, T3FREE, THYROIDAB,  in the last 72 hours No results found for this basename: VITAMINB12, FOLATE, FERRITIN, TIBC, IRON, RETICCTPCT,  in the last 72 hours Coags: No results found for this basename: PT, INR,  in the last 72 hours Microbiology: Recent Results (from the past 240 hour(s))  CULTURE, BLOOD (ROUTINE X 2)     Status: None   Collection Time    12/04/12 10:52 PM      Result Value Range Status   Specimen Description BLOOD ARM LEFT   Final   Special Requests BOTTLES DRAWN AEROBIC ONLY 10CC   Final   Culture  Setup Time 12/05/2012 05:17   Final   Culture     Final   Value:        BLOOD CULTURE RECEIVED NO GROWTH TO DATE CULTURE WILL BE HELD FOR 5 DAYS BEFORE ISSUING A FINAL NEGATIVE REPORT    Report Status PENDING   Incomplete  CULTURE, BLOOD (ROUTINE X 2)     Status: None   Collection Time    12/04/12 10:54 PM      Result Value Range Status   Specimen Description BLOOD ARM LEFT   Final   Special Requests BOTTLES DRAWN AEROBIC ONLY 10CC   Final   Culture  Setup Time 12/05/2012 05:17   Final   Culture     Final   Value:        BLOOD CULTURE RECEIVED NO GROWTH TO DATE CULTURE WILL BE HELD FOR 5 DAYS BEFORE ISSUING A FINAL NEGATIVE REPORT   Report Status PENDING   Incomplete  MRSA PCR SCREENING     Status: None   Collection Time    12/05/12  3:43 AM      Result Value Range Status   MRSA by PCR NEGATIVE  NEGATIVE Final   Comment:            The GeneXpert MRSA Assay (FDA     approved for NASAL specimens     only), is one component of a     comprehensive MRSA colonization     surveillance program. It is not     intended to diagnose MRSA     infection nor to guide or     monitor treatment for     MRSA infections.     BRIEF HOSPITAL COURSE:  Bilateral pulmonary embolism  - patient presented with a syncopal episode, she was tachycardic on admission. A CT angiogram of the chest was positive for pulmonary embolism. She was started on heparin infusion and admitted to the step down unit. After long discussion with the patient and the patient's daughter at bedside, it was decided that we transition her to Xarelto instead of Coumadin. The patient's daughter indicated, it will be almost impossible for the patient to make a trip to her doctor's office frequently for INR checks as the patient has significant dementia and does not well in unfamiliar surroundings.Risk/benefits of coumadin vs. xarelto were discussed.Risks of long term anticoagulation including catastrophic falls, GI bleeds causing life threatening and life disabling effects. Fall risk prevention discussed  - In any event, she has been stable throughout admission, she is not hypoxic, she is no longer tachycardic and is  not  stable enough to be discharged home. A 2-D echocardiogram did ECHO demonstrate pulmonary hypertension with moderate dilatation of right ventricle and moderate to severe dilatation right atrium-no prior echo for comparison but given no apparent history of comorbid conditions that would cause right-sided heart failure suspect all of this is secondary to acute pulmonary embolism  - Given advanced age, do not think any further workup to see what provoked this extensive clot burden is indicated at this time. I have discussed this with the patient's daughter, she does understand that the potentially could be dealing with an underlying malignancy and does not want any further workup at this stage of the patient's life.   DVT - Left lower extremity  - Continue anticoagulation therapy - As above  Elevation of cardiac enzymes  -Secondary to PE-not a candidate for further work up  Severe aortic stenosis  -Noted with murmur on exam  -Critical stenosis due to severely thickened and calcified leaflets with a valve area of 0.39 cm square  -Given advanced age would not be a surgical candidate and in setting of acute PE severe aortic stenosis likely contributed to her syncopal episode   Leucocytosis  -Secondary to to PE-afebrile and non toxic looking. UA negative for UTI, no PNA seen on CT Chest. Not started on any antibiotics during this admission.  Dementia  -Continue Namenda and Aricept   HTN (hypertension)  -Continue Norvasc   TODAY-DAY OF DISCHARGE:  Subjective:   Katerra Depuy today has no headache,no chest abdominal pain,no new weakness tingling or numbness, feels much better wants to go home today.   Objective:   Blood pressure 121/80, pulse 88, temperature 98 F (36.7 C), temperature source Oral, resp. rate 20, height 5\' 7"  (1.702 m), weight 56.8 kg (125 lb 3.5 oz), SpO2 93.00%.  Intake/Output Summary (Last 24 hours) at 12/07/12 1120 Last data filed at 12/07/12 1025  Gross per 24 hour   Intake    186 ml  Output      0 ml  Net    186 ml   Filed Weights   12/04/12 2300 12/05/12 0434  Weight: 63.504 kg (140 lb) 56.8 kg (125 lb 3.5 oz)    Exam Awake Alert,  somewhat confused. No new F.N deficits, Normal affect [place].AT,PERRAL Supple Neck,No JVD, No cervical lymphadenopathy appriciated.  Symmetrical Chest wall movement, Good air movement bilaterally, CTAB RRR,No Gallops,Rubs or new Murmurs, No Parasternal Heave +ve B.Sounds, Abd Soft, Non tender, No organomegaly appriciated, No rebound -guarding or rigidity. No Cyanosis, Clubbing or edema, No new Rash or bruise  DISCHARGE CONDITION: Stable  DISPOSITION: Home with home health services   DISCHARGE INSTRUCTIONS:    Activity:  As tolerated with Full fall precautions use walker/cane & assistance as needed  Diet recommendation: Diabetic Diet       Discharge Orders   Future Orders Complete By Expires     Call MD for:  difficulty breathing, headache or visual disturbances  As directed     Call MD for:  severe uncontrolled pain  As directed     Diet - low sodium heart healthy  As directed     Increase activity slowly  As directed        Follow-up Information   Follow up with [facility] Schedule an appointment as soon as possible for a visit in 1 week.   Contact information:   [address] [place] [address] [phone]     Total Time spent on discharge equals  45 minutes.  SignedJeoffrey Massed 12/07/2012 11:20 AM

## 2012-12-09 NOTE — Clinical Documentation Improvement (Addendum)
GENERIC DOCUMENTATION CLARIFICATION QUERY  THIS DOCUMENT IS NOT A PERMANENT PART OF THE MEDICAL RECORD  TO RESPOND TO THE THIS QUERY, FOLLOW THE INSTRUCTIONS BELOW:  1. If needed, update documentation for the patient's encounter via the notes activity.  2. Access this query again and click edit on the In [employer].  3. After updating, or not, click F2 to complete all highlighted (required) fields concerning your review. Select "additional documentation in the medical record" OR "no additional documentation provided".  4. Click Sign note button.  5. The deficiency will fall out of your In Basket *Please let us know if you are not able to complete this workflow by phone or e-mail (listed below).  Please update your documentation within the medical record to reflect your response to this query.                                                                                        12/09/12   To Shanker Levora Dredge, MD  / Associates,  In a better effort to capture your patient's severity of illness, reflect appropriate length of stay and utilization of resources, a review of the patient medical record has revealed the following indicators.    Based on your clinical judgment, please clarify and document in a progress note and/or discharge summary the clinical condition associated with the following supporting information:  In responding to this query please exercise your independent judgment.  The fact that a query is asked, does not imply that any particular answer is desired or expected.    Noted documenting "Bilateral pulmonary embolism with right ventricular strain" in H + P, and " ECHO demonstrate pulmonary hypertension with moderate dilatation of right ventricle and moderate to severe dilatation right atrium-" please clarify if note is stating patient has "right sided heart failure or other condition.  Thank you     Possible Clinical Conditions?  Right sided heart failure  Acute  Cor Pulmonale  Chronic Cor Pulmonale  Other Condition  Cannot Clinically Determine     Evaluation: 2 D Echo     You may use possible, probable, or suspect with inpatient documentation. possible, probable, suspected diagnoses MUST be documented at the time of discharge  Reviewed:  no additional documentation provided  Thank [facility]  Clinical Documentation Specialist: Pager [phone]  Health Information Management [place]

## 2012-12-11 ENCOUNTER — Encounter: Payer: Self-pay | Admitting: *Deleted

## 2012-12-11 LAB — CULTURE, BLOOD (ROUTINE X 2)

## 2012-12-12 ENCOUNTER — Encounter: Payer: Self-pay | Admitting: Internal Medicine

## 2012-12-12 ENCOUNTER — Ambulatory Visit (INDEPENDENT_AMBULATORY_CARE_PROVIDER_SITE_OTHER): Payer: Medicare Other | Admitting: Internal Medicine

## 2012-12-12 VITALS — BP 140/76 | HR 81 | Temp 97.7°F | Resp 14 | Ht 67.0 in | Wt 135.4 lb

## 2012-12-12 DIAGNOSIS — B351 Tinea unguium: Secondary | ICD-10-CM

## 2012-12-12 DIAGNOSIS — R600 Localized edema: Secondary | ICD-10-CM

## 2012-12-12 DIAGNOSIS — D75839 Thrombocytosis, unspecified: Secondary | ICD-10-CM | POA: Insufficient documentation

## 2012-12-12 DIAGNOSIS — D72829 Elevated white blood cell count, unspecified: Secondary | ICD-10-CM

## 2012-12-12 DIAGNOSIS — I2699 Other pulmonary embolism without acute cor pulmonale: Secondary | ICD-10-CM

## 2012-12-12 DIAGNOSIS — D473 Essential (hemorrhagic) thrombocythemia: Secondary | ICD-10-CM

## 2012-12-12 DIAGNOSIS — R609 Edema, unspecified: Secondary | ICD-10-CM

## 2012-12-12 DIAGNOSIS — F03918 Unspecified dementia, unspecified severity, with other behavioral disturbance: Secondary | ICD-10-CM

## 2012-12-12 DIAGNOSIS — R748 Abnormal levels of other serum enzymes: Secondary | ICD-10-CM

## 2012-12-12 DIAGNOSIS — F0391 Unspecified dementia with behavioral disturbance: Secondary | ICD-10-CM

## 2012-12-12 DIAGNOSIS — I10 Essential (primary) hypertension: Secondary | ICD-10-CM

## 2012-12-12 NOTE — Progress Notes (Signed)
Patient ID: Caroline Schroeder, female   DOB: 11-Jul-1920, 79 y.o.   MRN: [id number] Code Status: DNR  No Known Allergies  Chief Complaint  Patient presents with  . Hospitalization Follow-up    HPI: Patient is a 79 y.o. white female seen in the office today for a hospital f/u after she had an episode of syncope at home.  Her daughter tells me she had about 2 weeks of increased leg swelling that she had noticed, and then, the day of her trip to the hospital, had an episode of shortness of breath that seemed to resolve, but then had a second episode where she gasped for air, and that was when she went briefly unresponsive.  She did arouse and fight off the EMS staff trying to take her to the ED.    She was found to have a DVT in her LLE and bilateral pulmonary emboli.  She was bridged xarelto.  She had an echo with valvular disease present.   She had some elevation of her cardiac enzymes at the time of admission.  Her severe edema of her left leg caused two small areas of ulceration and she also developed erythema and warmth requiring treatment for cellulitis.    Two small open areas on her left leg remain, but the redness has resolved.  She has some mild drainage from the one area.  She has severe swelling that still persists.    Her daughter notes that her toenails are long, thick, and fungal, and would like to take her out to podiatry, but, due to her dementia, it is rare that I am able to see her in my office--she frequently refuses to leave the house for appointments and her daughter has very high caregiver burden due to her behaviors.    Overall, she is doing quite well medically though her behaviors continue to be problematic off and on at home.      Review of Systems:  Review of Systems  Constitutional: Negative for fever, chills, weight loss and malaise/fatigue.  HENT: Negative for congestion.   Eyes: Negative for blurred vision.  Respiratory: Negative for cough and shortness of breath.    Cardiovascular: Positive for leg swelling. Negative for chest pain, palpitations, orthopnea and PND.  Gastrointestinal: Negative for abdominal pain.  Genitourinary: Negative for dysuria.  Musculoskeletal: Negative for falls.  Skin:       Bluish color to left foot, two pinpoint open areas on medial left lower leg with the more distal draining a small amount of serosanguinous drainage and requiring a dressing  Neurological: Positive for loss of consciousness. Negative for dizziness, sensory change, focal weakness, weakness and headaches.  Endo/Heme/Allergies: Bruises/bleeds easily.  Psychiatric/Behavioral: Positive for depression and memory loss. The patient has insomnia.     Past Medical History  Diagnosis Date  . Dementia   . Hypertension   . High cholesterol   . Essential thrombocythemia   . Onychia and paronychia of toe   . Other specified disease of white blood cells   . Abnormality of gait   . Hyperpotassemia   . Urinary frequency   . Alzheimer's disease   . Alveolitis of jaw   . Osteoporosis, unspecified   . Dementia in conditions classified elsewhere without behavioral disturbance(294.10)   . Hemorrhage of gastrointestinal tract, unspecified   . Osteoarthrosis, unspecified whether generalized or localized, unspecified site   . Memory loss   . Unspecified urinary incontinence    Past Surgical History  Procedure Laterality Date  .  Appendectomy     Social History:   reports that she has never smoked. She does not have any smokeless tobacco history on file. She reports that  drinks alcohol. She reports that she does not use illicit drugs.  Family History  Problem Relation Age of Onset  . Stroke Brother   . Cancer Mother     colon  . Heart attack Father     Medications: Patient's Medications  New Prescriptions   No medications on file  Previous Medications   AMLODIPINE (NORVASC) 5 MG TABLET    Take 5 mg by mouth daily.   ASPIRIN 81 MG CHEWABLE TABLET    Chew 81  mg by mouth daily.   CALCIUM-VITAMIN D (OSCAL WITH D) 500-200 MG-UNIT PER TABLET    Take 1 tablet by mouth 2 (two) times daily.   DIVALPROEX (DEPAKOTE SPRINKLE) 125 MG CAPSULE    Take 125 mg by mouth daily.   DONEPEZIL (ARICEPT) 10 MG TABLET    Take 10 mg by mouth at bedtime.   FEEDING SUPPLEMENT (ENSURE COMPLETE) LIQD    Take 237 mLs by mouth daily as needed (suboptimal intake).   MEMANTINE (NAMENDA) 10 MG TABLET    Take 10 mg by mouth 2 (two) times daily.   MULTIPLE VITAMIN (MULTIVITAMIN WITH MINERALS) TABS    Take 1 tablet by mouth daily.   RIVAROXABAN (XARELTO) 15 MG TABS TABLET    Take 1 tablet (15 mg total) by mouth 2 (two) times daily. Take twice daily till 12/26/12-from 12/27/12 take 20 mg once a day   RIVAROXABAN (XARELTO) 20 MG TABS    Take 1 tablet (20 mg total) by mouth daily with supper. First dose on Fri 12/27/12  Modified Medications   No medications on file  Discontinued Medications   No medications on file   Physical Exam: Filed Vitals:   12/12/12 1417  BP: 140/76  Pulse: 81  Temp: 97.7 F (36.5 C)  TempSrc: Oral  Resp: 14  Height: 5\' 7"  (1.702 m)  Weight: 135 lb 6.4 oz (61.417 kg)   Physical Exam  Constitutional:  Frail caucasian female, NAD  HENT:  Head: Normocephalic and atraumatic.  Eyes: EOM are normal. Pupils are equal, round, and reactive to light.  Neck: No JVD present.  Cardiovascular: Normal rate, regular rhythm and intact distal pulses.   Murmur heard. Pulmonary/Chest: Effort normal and breath sounds normal. She has no wheezes.  Abdominal: Soft. Bowel sounds are normal. She exhibits no distension. There is no tenderness.  Musculoskeletal: Normal range of motion.  Hand contracture present  Neurological: She is alert.  Oriented to person only  Skin: Skin is warm.  Psychiatric: Cognition and memory are impaired. She expresses inappropriate judgment. She exhibits a depressed mood. She exhibits abnormal recent memory.  Agitated at home at times, not here  today    Labs reviewed: Basic Metabolic Panel:  Recent Labs  16/10/96 2036 12/05/12 0330 12/06/12 0945  NA 142 142 145  K 4.3 4.0 3.5  CL 102 106 111  CO2 25 26 22   GLUCOSE 198* 128* 163*  BUN 27* 28* 27*  CREATININE 0.89 0.83 0.80  CALCIUM 9.3 8.8 8.2*   Liver Function Tests:  Recent Labs  12/04/12 2036 12/05/12 0330  AST 45* 34  ALT 33 27  ALKPHOS 113 97  BILITOT 0.6 0.5  PROT 6.8 6.3  ALBUMIN 3.0* 2.7*   CBC:  Recent Labs  12/04/12 2036 12/05/12 0330 12/07/12 0542  WBC 20.9* 18.6* 13.4*  NEUTROABS 19.1* 16.8*  --   HGB 14.4 13.3 12.2  HCT 45.0 41.6 39.7  MCV 80.2 79.4 81.2  PLT 344 309 343   Past Procedures: Dg Chest 2 View  12/04/2012 Mild cardiomegaly noted with chronically prominent interstitial markings but no evidence for alveolar edema or focal pulmonary opacity. No pleural effusion. No acute osseous finding. Stable mid thoracic compression deformity at T8. IMPRESSION: Cardiomegaly with stable prominent interstitial markings but no acute finding or significant interval change.   Ct Head Wo Contrast  12/05/2012  Mild diffuse cortical volume loss noted with proportional ventricular prominence. No acute hemorrhage, acute infarction, or mass lesion is seen. No midline shift. The orbits and paranasal sinuses are intact. No skull fracture. IMPRESSION: No acute intracranial finding.   Ct Angio Chest W/cm &/or Wo Cm  12/05/2012  There are large bilateral main pulmonary emboli extending to multiple distal segments including the right middle lobe, lingula, and lower lobes. Cardiomegaly is noted. There is mild straightening of the interventricular septum that could indicate right heart strain. No pericardial effusion. Moderate atheromatous aortic calcification noted without aneurysm. No lymphadenopathy. Biapical presumed pleural thickening/scarring noted. Dependent bibasilar atelectasis is noted. There is also subpleural reticulation along the left upper lobe for  example image 41, raising the question of any prior radiation therapy to the left breast or other cause of scarring. No acute osseous abnormality. T8 compression fracture again noted. Remote sternal deformity redemonstrated. IMPRESSION: Bilateral main pulmonary arterial emboli, overall large clot burden, with suggestion of right heart strain.   Echocardiogram 12/05/12- Left ventricle: The cavity size was normal. There was moderate concentric hypertrophy. Systolic function was normal. The estimated ejection fraction was in the range of 60% to 65%. Wall motion was normal; there were no regional wall motion abnormalities. Doppler parameters are consistent with abnormal left ventricular relaxation (grade 1 diastolic dysfunction). - Aortic valve: Trileaflet; severely thickened, severely calcified leaflets. Cusp separation was severely reduced. There was critical stenosis. Valve area: 0.39cm^2(VTI). Valve area: 0.42cm^2 (Vmax). - Mitral valve: Mildly to moderately calcified annulus. Mild regurgitation. - Left atrium: The atrium was mildly dilated. - Right ventricle: The cavity size was moderately dilated. Wall thickness was normal. Systolic function was mildly reduced. - Right atrium: The atrium was moderately to severely dilated. - Pulmonary arteries: Systolic pressure was moderately increased. PA peak pressure: 52mm Hg (S).  12/05/12:  LE venous doppler US - No evidence of deep vein thrombosis or superficial thrombus involving the right lower extremity. - Findings consistent with acute deep vein thrombosis involving the posterior tibial vein coursing through the popliteal and femoral veins of the left lower extremity. Also noted is a superficial thrombus of the lesser saphenous vein - No evidence of Baker's cyst on the right or left.  Assessment/Plan Bilateral pulmonary embolism Is now on xarelto 15mg  po bid through 6/26, then 20mg  thereafter.  Just had lab work prior to discharge.  I  would like her to come in for f/u labs, but it is unclear whether she will be wiling to leave the house.  She has refused further therapy at home and actually does quite well physically, ambulating up and down stairs unassisted. Oxygen saturations are wnl.  Dementia Suspected to be vascular dementia with behaviors.  Is helped somewhat by depakote, but she still does have periods of refusals and combativeness.  I have had to see her in her car once due to her refusal to come inside.  Her daughter is her caregiver.  Pt refuses further  therapy at home.  HTN (hypertension) Satisfactory given her age and comorbidities today.  Goal is <150/90.  Leucocytosis Resolved prior to discharge.  She had one prior elevated wbc count when she was seen in the office (mild).  Has not had significant weight loss.  She was treated at the hospital for a UTI and for cellulitis.  Edema of left lower extremity Due to DVT left lower extremity.  On xarelto to prevent additional PEs.  Is elevating legs at rest.  Leg remains quite bluish and swollen with two open areas, one draining serosanguinous fluid very lightly.    Elevation of cardiac enzymes Cardiac strain from b/l PE.  Does not seem to be much different functionally speaking despite her recent severe illness.  Thrombocytosis Had been ongoing prior to her hospitalization, but seemed to resolve while in the hospital.  I wonder if this was the reason she developed her hypercoagulable state.  She's not lost significant weight, and has no other clear etiology (malignancy or other inflammatory state) that we know of.  She and her daughter do not want this worked up further at this time due to her dementia and goals of care.   Onychomycosis Has very hypertrophic toenails that are cutting into adjacent toes.  Recommended she be seen by podiatry, but patient is unwilling to leave her home most of the time so referred to Dr. Tia Alert for home podiatric services.      Return to clinic in 6 mos.  Sooner if problems arise and her daughter can get her to come.  They live in [place]

## 2012-12-12 NOTE — Patient Instructions (Addendum)
I will contact Dr. Jeanice Lim and see if she can come to your house and trim your toenails.

## 2012-12-14 ENCOUNTER — Other Ambulatory Visit: Payer: Self-pay | Admitting: Internal Medicine

## 2012-12-14 ENCOUNTER — Encounter: Payer: Self-pay | Admitting: Internal Medicine

## 2012-12-14 DIAGNOSIS — B351 Tinea unguium: Secondary | ICD-10-CM | POA: Insufficient documentation

## 2012-12-14 NOTE — Assessment & Plan Note (Signed)
Due to DVT left lower extremity.  On xarelto to prevent additional PEs.  Is elevating legs at rest.  Leg remains quite bluish and swollen with two open areas, one draining serosanguinous fluid very lightly.

## 2012-12-14 NOTE — Assessment & Plan Note (Signed)
Satisfactory given her age and comorbidities today.  Goal is <150/90.

## 2012-12-14 NOTE — Assessment & Plan Note (Signed)
Cardiac strain from b/l PE.  Does not seem to be much different functionally speaking despite her recent severe illness.

## 2012-12-14 NOTE — Assessment & Plan Note (Signed)
Suspected to be vascular dementia with behaviors.  Is helped somewhat by depakote, but she still does have periods of refusals and combativeness.  I have had to see her in her car once due to her refusal to come inside.  Her daughter is her caregiver.  Pt refuses further therapy at home.

## 2012-12-14 NOTE — Assessment & Plan Note (Signed)
Has very hypertrophic toenails that are cutting into adjacent toes.  Recommended she be seen by podiatry, but patient is unwilling to leave her home most of the time so referred to Dr. Tia Alert for home podiatric services.

## 2012-12-14 NOTE — Assessment & Plan Note (Signed)
Had been ongoing prior to her hospitalization, but seemed to resolve while in the hospital.  I wonder if this was the reason she developed her hypercoagulable state.  She's not lost significant weight, and has no other clear etiology (malignancy or other inflammatory state) that we know of.  She and her daughter do not want this worked up further at this time due to her dementia and goals of care.

## 2012-12-14 NOTE — Assessment & Plan Note (Signed)
Resolved prior to discharge.  She had one prior elevated wbc count when she was seen in the office (mild).  Has not had significant weight loss.  She was treated at the hospital for a UTI and for cellulitis.

## 2012-12-14 NOTE — Assessment & Plan Note (Addendum)
Is now on xarelto 15mg  po bid through 6/26, then 20mg  thereafter.  Just had lab work prior to discharge.  I would like her to come in for f/u labs, but it is unclear whether she will be wiling to leave the house.  She has refused further therapy at home and actually does quite well physically, ambulating up and down stairs unassisted. Oxygen saturations are wnl.

## 2012-12-19 ENCOUNTER — Ambulatory Visit
Admission: RE | Admit: 2012-12-19 | Discharge: 2012-12-19 | Disposition: A | Discharge status: Home / Self Care | Payer: Medicare Other | Source: Ambulatory Visit | Attending: Internal Medicine | Admitting: Internal Medicine

## 2012-12-19 ENCOUNTER — Other Ambulatory Visit: Payer: Self-pay | Admitting: Neurology

## 2012-12-19 ENCOUNTER — Ambulatory Visit (INDEPENDENT_AMBULATORY_CARE_PROVIDER_SITE_OTHER): Payer: Medicare Other | Admitting: Internal Medicine

## 2012-12-19 ENCOUNTER — Encounter: Payer: Self-pay | Admitting: Internal Medicine

## 2012-12-19 VITALS — BP 118/62 | HR 97 | Temp 98.0°F | Resp 13 | Ht 67.0 in

## 2012-12-19 DIAGNOSIS — M25559 Pain in unspecified hip: Secondary | ICD-10-CM

## 2012-12-19 DIAGNOSIS — R29898 Other symptoms and signs involving the musculoskeletal system: Secondary | ICD-10-CM

## 2012-12-19 DIAGNOSIS — W19XXXA Unspecified fall, initial encounter: Secondary | ICD-10-CM

## 2012-12-19 DIAGNOSIS — M25551 Pain in right hip: Secondary | ICD-10-CM

## 2012-12-19 DIAGNOSIS — Y92009 Unspecified place in unspecified non-institutional (private) residence as the place of occurrence of the external cause: Secondary | ICD-10-CM

## 2012-12-19 NOTE — Patient Instructions (Addendum)
Stop aspirin.   Get hip and pelvis xrays due to right leg weakness and pain Get CT brain to rule out brain bleed from blood thinners. We will then determine if she can go back home, to the hospital or to a skilled nursing facility.

## 2012-12-19 NOTE — Progress Notes (Signed)
Patient ID: Caroline Schroeder, female   DOB: [birth year], 79 y.o.   MRN: [id number] Location:  [facility] [place] [facility]  Code Status: DNR  No Known Allergies  Chief Complaint  Patient presents with  . Fall    Right leg pain and bleeding from mouth. Fell at 5:30am this morning    HPI: Patient is a 79 y.o. white female seen in the office today for an acute visit.  Didn't answer at supper time when called for dinner.  Was picking at a spot on her leg.  Was not answering them, and said she could not get up off the bed.  Right leg hurt and was pointing to her hip.  They walked her downstairs.  Didn't hardly eat her supper.    Pt was lifting her leg with her hands.  Stayed on her side in bed last night.  Then had fall this am in her bedroom.  Cannot put weight on right leg.  Has had a few times with dried blood in her mouth.  Is on the xarelto.  Daughter did not give the baby asa last night. Had big stains on her top the past few nights.     Review of Systems:  Review of Systems  Constitutional: Negative for fever and chills.  Respiratory: Positive for shortness of breath.   Cardiovascular: Positive for leg swelling. Negative for chest pain.  Gastrointestinal: Negative for constipation.  Musculoskeletal: Positive for falls.       Right leg pain  Skin: Negative for rash.  Neurological: Positive for dizziness and weakness. Negative for loss of consciousness.  Endo/Heme/Allergies: Bruises/bleeds easily.  Psychiatric/Behavioral: Positive for depression and memory loss. The patient has insomnia.      Past Medical History  Diagnosis Date  . Dementia   . Hypertension   . High cholesterol   . Essential thrombocythemia   . Onychia and paronychia of toe   . Other specified disease of white blood cells   . Abnormality of gait   . Hyperpotassemia   . Urinary frequency   . Alzheimer's disease   . Alveolitis of jaw   . Osteoporosis, unspecified   . Dementia in  conditions classified elsewhere without behavioral disturbance(294.10)   . Hemorrhage of gastrointestinal tract, unspecified   . Osteoarthrosis, unspecified whether generalized or localized, unspecified site   . Memory loss   . Unspecified urinary incontinence     Past Surgical History  Procedure Laterality Date  . Appendectomy      Social History:   reports that she has never smoked. She does not have any smokeless tobacco history on file. She reports that  drinks alcohol. She reports that she does not use illicit drugs.  Family History  Problem Relation Age of Onset  . Stroke Brother   . Cancer Mother     colon  . Heart attack Father     Medications: Patient's Medications  New Prescriptions   No medications on file  Previous Medications   AMLODIPINE (NORVASC) 5 MG TABLET    Take 5 mg by mouth daily.   ASPIRIN 81 MG CHEWABLE TABLET    Chew 81 mg by mouth daily.   CALCIUM-VITAMIN D (OSCAL WITH D) 500-200 MG-UNIT PER TABLET    Take 1 tablet by mouth 2 (two) times daily.   DIVALPROEX (DEPAKOTE SPRINKLE) 125 MG CAPSULE    Take 125 mg by mouth daily.   DONEPEZIL (ARICEPT) 10 MG TABLET    Take 10 mg by  mouth at bedtime.   FEEDING SUPPLEMENT (ENSURE COMPLETE) LIQD    Take 237 mLs by mouth daily as needed (suboptimal intake).   MEMANTINE (NAMENDA) 10 MG TABLET    Take 10 mg by mouth 2 (two) times daily.   MULTIPLE VITAMIN (MULTIVITAMIN WITH MINERALS) TABS    Take 1 tablet by mouth daily.   RIVAROXABAN (XARELTO) 15 MG TABS TABLET    Take 1 tablet (15 mg total) by mouth 2 (two) times daily. Take twice daily till 12/26/12-from 12/27/12 take 20 mg once a day   RIVAROXABAN (XARELTO) 20 MG TABS    Take 1 tablet (20 mg total) by mouth daily with supper. First dose on Fri 12/27/12  Modified Medications   No medications on file  Discontinued Medications   No medications on file     Physical Exam: Filed Vitals:   12/19/12 1119  BP: 118/62  Pulse: 97  Temp: 98 F (36.7 C)  TempSrc:  Oral  Resp: 13  Height: 5\' 7"  (1.702 m)  Physical Exam  Constitutional:  Chronically ill frail white female  HENT:  Head: Normocephalic and atraumatic.  Eyes: EOM are normal. Pupils are equal, round, and reactive to light.  Cardiovascular: Normal rate, regular rhythm and normal heart sounds.   Pulmonary/Chest: Effort normal and breath sounds normal. No respiratory distress.  Abdominal: Soft. Bowel sounds are normal. She exhibits no distension. There is no tenderness.  Musculoskeletal: She exhibits edema and tenderness.  Unable to bear full weight on right leg  Neurological: She is alert. No cranial nerve deficit.  Disoriented to time and place  Skin:  Skin tear present right leg     Labs reviewed: Basic Metabolic Panel:  Recent Labs  16/10/96 2036 12/05/12 0330 12/06/12 0945  NA 142 142 145  K 4.3 4.0 3.5  CL 102 106 111  CO2 25 26 22   GLUCOSE 198* 128* 163*  BUN 27* 28* 27*  CREATININE 0.89 0.83 0.80  CALCIUM 9.3 8.8 8.2*   Liver Function Tests:  Recent Labs  12/04/12 2036 12/05/12 0330  AST 45* 34  ALT 33 27  ALKPHOS 113 97  BILITOT 0.6 0.5  PROT 6.8 6.3  ALBUMIN 3.0* 2.7*  CBC:  Recent Labs  12/04/12 2036 12/05/12 0330 12/07/12 0542  WBC 20.9* 18.6* 13.4*  NEUTROABS 19.1* 16.8*  --   HGB 14.4 13.3 12.2  HCT 45.0 41.6 39.7  MCV 80.2 79.4 81.2  PLT 344 309 343   Lab Results  Component Value Date   HGBA1C 5.6 12/05/2012   Assessment/Plan 1. Right hip pain -use tylenol ES up to 3g per day - CT Head Wo Contrast; Future - DG Hip Bilateral W/Pelvis; Future - CBC with Differential - Basic metabolic panel  2. Fall at home, initial encounter -due to progression of her dementia, weakness, frailty most likely, but seems increasingly weak on right side so obtain CT brain to rule out acute stroke causing the fall and new weakness - CT Head Wo Contrast; Future - DG Hip Bilateral W/Pelvis; Future - CBC with Differential - Basic metabolic panel  3.  Right leg weakness -? Stroke vs. Due to trauma - CT Head Wo Contrast; Future - DG Hip Bilateral W/Pelvis; Future - CBC with Differential - Basic metabolic panel

## 2012-12-20 LAB — CBC WITH DIFFERENTIAL/PLATELET
Basophils Absolute: 0 10*3/uL (ref 0.0–0.2)
Basos: 0 % (ref 0–3)
Eos: 0 % (ref 0–5)
Eosinophils Absolute: 0 10*3/uL (ref 0.0–0.4)
HCT: 42 % (ref 34.0–46.6)
Hemoglobin: 12.9 g/dL (ref 11.1–15.9)
Immature Grans (Abs): 0.1 10*3/uL (ref 0.0–0.1)
Immature Granulocytes: 0 % (ref 0–2)
Lymphocytes Absolute: 1.4 10*3/uL (ref 0.7–3.1)
Lymphs: 6 % — ABNORMAL LOW (ref 14–46)
MCH: 24.6 pg — ABNORMAL LOW (ref 26.6–33.0)
MCHC: 30.7 g/dL — ABNORMAL LOW (ref 31.5–35.7)
MCV: 80 fL (ref 79–97)
Monocytes Absolute: 1.2 10*3/uL — ABNORMAL HIGH (ref 0.1–0.9)
Monocytes: 5 % (ref 4–12)
Neutrophils Absolute: 21.2 10*3/uL — ABNORMAL HIGH (ref 1.4–7.0)
Neutrophils Relative %: 89 % — ABNORMAL HIGH (ref 40–74)
RBC: 5.24 x10E6/uL (ref 3.77–5.28)
RDW: 21.5 % — ABNORMAL HIGH (ref 12.3–15.4)
WBC: 23.9 10*3/uL (ref 3.4–10.8)
nRBC: 0 % (ref 0–0)

## 2012-12-20 LAB — BASIC METABOLIC PANEL
BUN/Creatinine Ratio: 24 (ref 11–26)
BUN: 21 mg/dL (ref 10–36)
CO2: 24 mmol/L (ref 18–29)
Calcium: 9.9 mg/dL (ref 8.6–10.2)
Chloride: 98 mmol/L (ref 97–108)
Creatinine, Ser: 0.87 mg/dL (ref 0.57–1.00)
GFR calc Af Amer: 68 mL/min/{1.73_m2} (ref >59)
GFR calc non Af Amer: 59 mL/min/{1.73_m2} — ABNORMAL LOW (ref >59)
Glucose: 96 mg/dL (ref 65–99)
Potassium: 5.2 mmol/L (ref 3.5–5.2)
Sodium: 142 mmol/L (ref 134–144)

## 2012-12-25 ENCOUNTER — Other Ambulatory Visit: Payer: Self-pay | Admitting: *Deleted

## 2012-12-25 MED ORDER — AMLODIPINE BESYLATE 5 MG PO TABS: ORAL_TABLET | ORAL | Status: DC

## 2013-01-01 DIAGNOSIS — F0281 Dementia in other diseases classified elsewhere with behavioral disturbance: Secondary | ICD-10-CM

## 2013-01-01 DIAGNOSIS — F02818 Dementia in other diseases classified elsewhere, unspecified severity, with other behavioral disturbance: Secondary | ICD-10-CM

## 2013-01-01 DIAGNOSIS — F0280 Dementia in other diseases classified elsewhere without behavioral disturbance: Secondary | ICD-10-CM

## 2013-01-01 DIAGNOSIS — IMO0001 Reserved for inherently not codable concepts without codable children: Secondary | ICD-10-CM

## 2013-01-01 DIAGNOSIS — M25559 Pain in unspecified hip: Secondary | ICD-10-CM

## 2013-01-01 DIAGNOSIS — G309 Alzheimer's disease, unspecified: Secondary | ICD-10-CM

## 2013-02-05 ENCOUNTER — Other Ambulatory Visit: Payer: Self-pay

## 2013-03-14 ENCOUNTER — Other Ambulatory Visit: Payer: Self-pay | Admitting: *Deleted

## 2013-03-14 MED ORDER — RIVAROXABAN 20 MG PO TABS: 20.0000 mg | ORAL_TABLET | Freq: Every day | ORAL | Status: DC

## 2013-04-08 ENCOUNTER — Ambulatory Visit (INDEPENDENT_AMBULATORY_CARE_PROVIDER_SITE_OTHER): Payer: Medicare Other

## 2013-04-08 VITALS — BP 109/65 | HR 89 | Resp 16 | Ht 66.0 in | Wt 135.0 lb

## 2013-04-08 DIAGNOSIS — M79609 Pain in unspecified limb: Secondary | ICD-10-CM

## 2013-04-08 DIAGNOSIS — B351 Tinea unguium: Secondary | ICD-10-CM

## 2013-04-08 NOTE — Progress Notes (Signed)
  Subjective:    Patient ID: Caroline Schroeder, female    DOB: 1920/10/14, 79 y.o.   MRN: [id number]  HPI patient presents at this time for followup mycotic nail care, no changes in health or medication history are noted. Patient continues to have pain in dystrophic and criptotic nail.    Review of Systems  Constitutional: Negative.   HENT: Positive for hearing loss.   Eyes: Negative.   Respiratory: Positive for cough and shortness of breath.   Cardiovascular: Positive for leg swelling.  Gastrointestinal: Negative.   Endocrine: Negative.   Genitourinary: Negative.   Musculoskeletal: Negative.   Skin: Negative.   Allergic/Immunologic: Negative.   Neurological: Negative.   Hematological: Bruises/bleeds easily.  Psychiatric/Behavioral: Negative.        Objective:   Physical Exam  Constitutional: She is oriented to person, place, and time. She appears well-developed and well-nourished.  Cardiovascular:  Pulses:      Dorsalis pedis pulses are 1+ on the right side, and 1+ on the left side.       Posterior tibial pulses are 0 on the right side, and 0 on the left side.  Patient has +2 edema bilateral feet and ankles with pitting. Mild to moderate varicosities noted bilateral. No open wounds ulcerations noted at this time. Temperature is cold on palpation to all toes and forefoot. Capillary refill timed weighed 5 seconds all digits  Musculoskeletal: Normal range of motion.  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 She has normal reflexes.  Skin: Skin is warm and dry. No cyanosis. Nails show no clubbing.  Skin texture and turgor diminished hair growth is absent nails friable brittle tender on palpation with keratosis and discoloration. Nails are painful hallux second and third bilateral debridement and on palpation  Psychiatric: She has a normal mood and affect. She is  slowed and withdrawn.          Assessment & Plan:  Onychomycosis 1 through 5 bilateral. Nails 1-3 bilateral are tender on palpation and ambulation. Following debridement proximal nails are treated with lidocaine and Neosporin application. Reappointed in 3 months for followup in palliative nail care  Alvan Dame DPM

## 2013-04-08 NOTE — Patient Instructions (Signed)

## 2013-04-10 ENCOUNTER — Ambulatory Visit (INDEPENDENT_AMBULATORY_CARE_PROVIDER_SITE_OTHER): Payer: Medicare Other | Admitting: Nurse Practitioner

## 2013-04-10 ENCOUNTER — Encounter: Payer: Self-pay | Admitting: Nurse Practitioner

## 2013-04-10 VITALS — BP 112/58 | HR 81 | Temp 98.4°F | Resp 16 | Wt 131.8 lb

## 2013-04-10 DIAGNOSIS — L0390 Cellulitis, unspecified: Secondary | ICD-10-CM

## 2013-04-10 DIAGNOSIS — K921 Melena: Secondary | ICD-10-CM

## 2013-04-10 DIAGNOSIS — H6120 Impacted cerumen, unspecified ear: Secondary | ICD-10-CM

## 2013-04-10 DIAGNOSIS — L0291 Cutaneous abscess, unspecified: Secondary | ICD-10-CM

## 2013-04-10 DIAGNOSIS — H6123 Impacted cerumen, bilateral: Secondary | ICD-10-CM

## 2013-04-10 MED ORDER — DOXYCYCLINE HYCLATE 100 MG PO TABS: 100.0000 mg | ORAL_TABLET | Freq: Two times a day (BID) | ORAL | Status: DC

## 2013-04-10 NOTE — Patient Instructions (Signed)
Will get stat blood count; may need to take to the hospital if this is low (if this is your wish)  Will start doxycycline 100 mg twice daily

## 2013-04-10 NOTE — Progress Notes (Signed)
Patient ID: Caroline Schroeder, female   DOB: 10/28/1920, 79 y.o.   MRN: [id number]   No Known Allergies  Chief Complaint  Patient presents with  . Acute Visit    leg swelling & warm to the touch.  Marland Kitchen other    hearing issues in both ears   . other    blood in her stool  x 1 day  . Immunizations    would like the flu shot    HPI: Patient is a 79 y.o. female seen in the office today for multiple concerns; daughter providing all the history as pt is with advanced dementia.   for legs swelling and redness and on xarelto for blood clots since June 2014. Over the past week her legs are more swollen and red and weeping. No fevers or chills  Also, Was having episodes of blood in her saliva but this has decreased after ASA was stopped however today her daughter noticed blood in her stools this morning; does not normally look at stools so she does not know how long this has gone on for. Pts at baseline as far as energy level and responsiveness   Also daughter reports pt is very HOH and normally when ears are clogged she can not hear at all; having a hard time communicating with her due to this.    Review of Systems:  Per daughter Review of Systems  Constitutional: Negative for fever and chills.  HENT: Negative for nosebleeds.   Respiratory: Negative for shortness of breath.   Cardiovascular: Positive for leg swelling. Negative for chest pain.  Gastrointestinal: Positive for blood in stool and melena. Negative for nausea, vomiting, abdominal pain, diarrhea and constipation.  Genitourinary: Negative for dysuria, urgency, frequency and hematuria.  Musculoskeletal: Negative for myalgias.  Skin:       Red swollen legs  Neurological: Negative for dizziness, sensory change and focal weakness.    Past Medical History  Diagnosis Date  . Dementia   . Hypertension   . High cholesterol   . Essential thrombocythemia   . Onychia and paronychia of toe   . Other specified disease of white blood cells    . Abnormality of gait   . Hyperpotassemia   . Urinary frequency   . Alzheimer's disease   . Alveolitis of jaw   . Osteoporosis, unspecified   . Dementia in conditions classified elsewhere without behavioral disturbance(294.10)   . Hemorrhage of gastrointestinal tract, unspecified   . Osteoarthrosis, unspecified whether generalized or localized, unspecified site   . Memory loss   . Unspecified urinary incontinence   . CHF (congestive heart failure)    Past Surgical History  Procedure Laterality Date  . Appendectomy     Social History:   reports that she has never smoked. She does not have any smokeless tobacco history on file. She reports that she drinks alcohol. She reports that she does not use illicit drugs.  Family History  Problem Relation Age of Onset  . Stroke Brother   . Cancer Mother     colon  . Heart attack Father     Medications: Patient's Medications  New Prescriptions   No medications on file  Previous Medications   AMLODIPINE (NORVASC) 5 MG TABLET    Take one tablet by mouth once daily for blood pressure   CALCIUM-VITAMIN D (OSCAL WITH D) 500-200 MG-UNIT PER TABLET    Take 1 tablet by mouth 2 (two) times daily.   DIPTHERIA-TETANUS TOXOIDS (DECAVAC) 2-2 LF/0.5ML INJECTION  Inject 0.5 mLs into the muscle once.   DIVALPROEX (DEPAKOTE SPRINKLE) 125 MG CAPSULE    Take 125 mg by mouth daily.   DONEPEZIL (ARICEPT) 10 MG TABLET    Take 10 mg by mouth at bedtime.   FEEDING SUPPLEMENT (ENSURE COMPLETE) LIQD    Take 237 mLs by mouth daily as needed (suboptimal intake).   MEMANTINE (NAMENDA) 10 MG TABLET    Take 10 mg by mouth 2 (two) times daily.   MULTIPLE VITAMIN (MULTIVITAMIN WITH MINERALS) TABS    Take 1 tablet by mouth daily.  Modified Medications   Modified Medication Previous Medication   RIVAROXABAN (XARELTO) 15 MG TABS TABLET Rivaroxaban (XARELTO) 15 MG TABS tablet      Take 15 mg by mouth daily. Take one tablet daily    Take 1 tablet (15 mg total) by mouth  2 (two) times daily. Take twice daily till 12/26/12-from 12/27/12 take 20 mg once a day  Discontinued Medications   RIVAROXABAN (XARELTO) 20 MG TABS TABLET    Take 1 tablet (20 mg total) by mouth daily with supper. First dose on Fri 12/27/12     Physical Exam:  Filed Vitals:   04/10/13 1535  BP: 112/58  Pulse: 81  Temp: 98.4 F (36.9 C)  TempSrc: Oral  Resp: 16  Weight: 131 lb 12.8 oz (59.784 kg)  SpO2: 95%    Physical Exam  Vitals reviewed. Constitutional: She is well-developed, well-nourished, and in no distress. No distress.  HENT:  Head: Normocephalic and atraumatic.  Right Ear: External ear normal.  Left Ear: External ear normal.  Mouth/Throat: Oropharynx is clear and moist. No oropharyngeal exudate.  Bilateral cerumen impaction   Eyes: Conjunctivae and EOM are normal. Pupils are equal, round, and reactive to light.  Neck: Normal range of motion. Neck supple.  Cardiovascular: Normal rate, regular rhythm and normal heart sounds.   Pulmonary/Chest: Effort normal and breath sounds normal. No respiratory distress.  Abdominal: Soft. Bowel sounds are normal. She exhibits no distension. There is no tenderness.  Genitourinary: Rectal exam shows no external hemorrhoid.  Frank blood at and around rectum   Musculoskeletal: She exhibits edema.  3+ pitting edema bilaterally; warm and weeping bilaterally   Neurological: She is alert.  Skin: Skin is warm and dry. She is not diaphoretic.     Labs reviewed: Basic Metabolic Panel:  Recent Labs  16/10/96 0330 12/06/12 0945 12/19/12 1217  NA 142 145 142  K 4.0 3.5 5.2  CL 106 111 98  CO2 26 22 24   GLUCOSE 128* 163* 96  BUN 28* 27* 21  CREATININE 0.83 0.80 0.87  CALCIUM 8.8 8.2* 9.9   Liver Function Tests:  Recent Labs  12/04/12 2036 12/05/12 0330  AST 45* 34  ALT 33 27  ALKPHOS 113 97  BILITOT 0.6 0.5  PROT 6.8 6.3  ALBUMIN 3.0* 2.7*   No results found for this basename: LIPASE, AMYLASE,  in the last 8760  hours No results found for this basename: AMMONIA,  in the last 8760 hours CBC:  Recent Labs  12/04/12 2036 12/05/12 0330 12/07/12 0542 12/19/12 1217  WBC 20.9* 18.6* 13.4* 23.9*  NEUTROABS 19.1* 16.8*  --  21.2*  HGB 14.4 13.3 12.2 12.9  HCT 45.0 41.6 39.7 42.0  MCV 80.2 79.4 81.2 80  PLT 344 309 343  --     Assessment/Plan 1. Melena -1 bloody stool this morning none since and frank blood at rectum Daughter has made it clear no hospitalization at  this time; educated her extensively that this could mean she may die if the bleeding conts without medical intervention; daughter is aware and understands-also educated her to seek immediate medical intervention if another bloody stool occurs (if she chooses to do so) she understands  Agreeable to stat CBC and if blood count significantly low she will take her for transfusion only Will stop xarelto at this time.  - CBC With differential/Platelet  2. Cellulitis Bilateral lower extremities -to keep elevated and will give doxycyline at this time - doxycycline (VIBRA-TABS) 100 MG tablet; Take 1 tablet (100 mg total) by mouth 2 (two) times daily.  Dispense: 20 tablet; Refill: 0  3. Cerumen impaction Ear lavage done and was successful to left ear only; pt tolerated well  To follow up in 1 week

## 2013-04-11 LAB — CBC WITH DIFFERENTIAL
Basophils Absolute: 0 10*3/uL (ref 0.0–0.2)
Basos: 0 %
Eosinophils Absolute: 0.1 10*3/uL (ref 0.0–0.4)
Hemoglobin: 10.7 g/dL — ABNORMAL LOW (ref 11.1–15.9)
Immature Grans (Abs): 0.1 10*3/uL (ref 0.0–0.1)
Lymphs: 7 %
MCH: 23.3 pg — ABNORMAL LOW (ref 26.6–33.0)
MCHC: 31.5 g/dL (ref 31.5–35.7)
Monocytes Absolute: 0.9 10*3/uL (ref 0.1–0.9)
Neutrophils Absolute: 15.4 10*3/uL — ABNORMAL HIGH (ref 1.4–7.0)
Neutrophils Relative %: 87 %
RDW: 20.5 % — ABNORMAL HIGH (ref 12.3–15.4)
nRBC: 0 % (ref 0–0)

## 2013-04-17 ENCOUNTER — Ambulatory Visit (INDEPENDENT_AMBULATORY_CARE_PROVIDER_SITE_OTHER): Payer: Medicare Other | Admitting: Nurse Practitioner

## 2013-04-17 ENCOUNTER — Encounter: Payer: Self-pay | Admitting: Nurse Practitioner

## 2013-04-17 ENCOUNTER — Encounter: Payer: Self-pay | Admitting: *Deleted

## 2013-04-17 VITALS — BP 140/76 | HR 72 | Temp 98.0°F | Resp 16 | Wt 136.8 lb

## 2013-04-17 DIAGNOSIS — K921 Melena: Secondary | ICD-10-CM

## 2013-04-17 DIAGNOSIS — L0390 Cellulitis, unspecified: Secondary | ICD-10-CM

## 2013-04-17 DIAGNOSIS — H6120 Impacted cerumen, unspecified ear: Secondary | ICD-10-CM

## 2013-04-17 DIAGNOSIS — L0291 Cutaneous abscess, unspecified: Secondary | ICD-10-CM

## 2013-04-17 DIAGNOSIS — H6121 Impacted cerumen, right ear: Secondary | ICD-10-CM

## 2013-04-17 NOTE — Patient Instructions (Signed)
Will have you use compression wraps bilaterally Seek medical care if redness, warmth, fever occurs  Follow up with DR REED before you move  Will get blood work today

## 2013-04-17 NOTE — Progress Notes (Signed)
Patient ID: Caroline Schroeder, female   DOB: 1921/06/17, 79 y.o.   MRN: [id number]   No Known Allergies  Chief Complaint  Patient presents with  . Medical Managment of Chronic Issues    1 week f/u  . other    hasn't seen any blood since not taking Xarelto    HPI: Patient is a 79 y.o. female pt of Dr Renato Gails seen in the office today for follow up on rectal bleeding and cellulitis; daughter at visit providing history  No bleeding noted from daughter since pt was last seen  Swelling has not changed however with decreased redness and no warmth in bilateral lower extremities.  No concerns today No fevers or chills, no shortness of breath, chest pains noted per daughter  Review of Systems:  Review of Systems  Constitutional: Negative for fever and chills.  HENT: Positive for hearing loss.   Respiratory: Negative for cough, hemoptysis, sputum production and shortness of breath.   Cardiovascular: Positive for leg swelling. Negative for chest pain.  Gastrointestinal: Negative for abdominal pain, diarrhea, constipation, blood in stool and melena.  Genitourinary: Negative for dysuria, urgency and frequency.  Musculoskeletal: Negative for myalgias.  Skin: Negative for itching and rash.  Neurological: Negative for dizziness.     Past Medical History  Diagnosis Date  . Dementia   . Hypertension   . High cholesterol   . Essential thrombocythemia   . Onychia and paronychia of toe   . Other specified disease of white blood cells   . Abnormality of gait   . Hyperpotassemia   . Urinary frequency   . Alzheimer's disease   . Alveolitis of jaw   . Osteoporosis, unspecified   . Dementia in conditions classified elsewhere without behavioral disturbance(294.10)   . Hemorrhage of gastrointestinal tract, unspecified   . Osteoarthrosis, unspecified whether generalized or localized, unspecified site   . Memory loss   . Unspecified urinary incontinence   . CHF (congestive heart failure)    Past  Surgical History  Procedure Laterality Date  . Appendectomy     Social History:   reports that she has never smoked. She does not have any smokeless tobacco history on file. She reports that she drinks alcohol. She reports that she does not use illicit drugs.  Family History  Problem Relation Age of Onset  . Stroke Brother   . Cancer Mother     colon  . Heart attack Father     Medications: Patient's Medications  New Prescriptions   No medications on file  Previous Medications   AMLODIPINE (NORVASC) 5 MG TABLET    Take one tablet by mouth once daily for blood pressure   CALCIUM-VITAMIN D (OSCAL WITH D) 500-200 MG-UNIT PER TABLET    Take 1 tablet by mouth 2 (two) times daily.   DIPTHERIA-TETANUS TOXOIDS (DECAVAC) 2-2 LF/0.5ML INJECTION    Inject 0.5 mLs into the muscle once.   DIVALPROEX (DEPAKOTE SPRINKLE) 125 MG CAPSULE    Take 125 mg by mouth daily.   DONEPEZIL (ARICEPT) 10 MG TABLET    Take 10 mg by mouth at bedtime.   DOXYCYCLINE (VIBRA-TABS) 100 MG TABLET    Take 1 tablet (100 mg total) by mouth 2 (two) times daily.   FEEDING SUPPLEMENT (ENSURE COMPLETE) LIQD    Take 237 mLs by mouth daily as needed (suboptimal intake).   MEMANTINE (NAMENDA) 10 MG TABLET    Take 10 mg by mouth 2 (two) times daily.   MULTIPLE VITAMIN (MULTIVITAMIN WITH MINERALS)  TABS    Take 1 tablet by mouth daily.   RIVAROXABAN (XARELTO) 15 MG TABS TABLET    Take 15 mg by mouth daily. Take one tablet daily  Modified Medications   No medications on file  Discontinued Medications   No medications on file     Physical Exam:  Filed Vitals:   04/17/13 1503  BP: 140/76  Pulse: 72  Temp: 98 F (36.7 C)  TempSrc: Oral  Resp: 16  Weight: 136 lb 12.8 oz (62.052 kg)  SpO2: 94%    Physical Exam  Constitutional: She is well-developed, well-nourished, and in no distress. No distress.  HENT:  Head: Normocephalic and atraumatic.  Mouth/Throat: Oropharynx is clear and moist. No oropharyngeal exudate.   Eyes: Conjunctivae and EOM are normal. Pupils are equal, round, and reactive to light.  Neck: Normal range of motion. Neck supple.  Cardiovascular: Normal rate, regular rhythm and normal heart sounds.   Pulmonary/Chest: Effort normal and breath sounds normal.  Abdominal: Soft. Bowel sounds are normal. She exhibits no distension. There is no tenderness. There is no rebound.  Genitourinary: Guaiac positive stool.  Musculoskeletal: Normal range of motion. She exhibits edema (3+ to bilateral lower extermities ). She exhibits no tenderness.  Neurological: She is alert.  Skin: Skin is warm and dry. She is not diaphoretic. No erythema.  Psychiatric: Affect normal.     Labs reviewed: Basic Metabolic Panel:  Recent Labs  82/95/62 0330 12/06/12 0945 12/19/12 1217  NA 142 145 142  K 4.0 3.5 5.2  CL 106 111 98  CO2 26 22 24   GLUCOSE 128* 163* 96  BUN 28* 27* 21  CREATININE 0.83 0.80 0.87  CALCIUM 8.8 8.2* 9.9   Liver Function Tests:  Recent Labs  12/04/12 2036 12/05/12 0330  AST 45* 34  ALT 33 27  ALKPHOS 113 97  BILITOT 0.6 0.5  PROT 6.8 6.3  ALBUMIN 3.0* 2.7*   No results found for this basename: LIPASE, AMYLASE,  in the last 8760 hours No results found for this basename: AMMONIA,  in the last 8760 hours CBC:  Recent Labs  12/05/12 0330 12/07/12 0542 12/19/12 1217 04/10/13 1629  WBC 18.6* 13.4* 23.9* 17.6*  NEUTROABS 16.8*  --  21.2* 15.4*  HGB 13.3 12.2 12.9 10.7*  HCT 41.6 39.7 42.0 34.0  MCV 79.4 81.2 80 74*  PLT 309 343  --  458*     Assessment/Plan 1. Melena -hem positive stool; cont to hold xarelto  -will follow up blood work today - CBC With differential/Platelet - Comprehensive metabolic panel  2. Cellulitis -improved still with swelling - may use compression hose daily (place in the morning and take off at night) to control swelling if pt will tolerate them - to seek medical attention if swelling or redness worsens, heat or sores appear, fever  or chills occur  3. Cerumen impaction, right Ear lavage done and was successful; pt tolerated well  Daughter reports she has sold her house and they will be moving to [place] in the next month with pt; advised to make an appt with Dr Renato Gails before they leave for follow up.

## 2013-04-18 LAB — CBC WITH DIFFERENTIAL
Basos: 0 %
Eos: 0 %
HCT: 38 % (ref 34.0–46.6)
Lymphocytes Absolute: 1.3 10*3/uL (ref 0.7–3.1)
MCH: 22.2 pg — ABNORMAL LOW (ref 26.6–33.0)
MCV: 75 fL — ABNORMAL LOW (ref 79–97)
Monocytes Absolute: 0.9 10*3/uL (ref 0.1–0.9)
Monocytes: 4 %
Neutrophils Absolute: 20.2 10*3/uL — ABNORMAL HIGH (ref 1.4–7.0)
RBC: 5.08 x10E6/uL (ref 3.77–5.28)
WBC: 22.6 10*3/uL (ref 3.4–10.8)

## 2013-04-18 LAB — COMPREHENSIVE METABOLIC PANEL
ALT: 43 IU/L — ABNORMAL HIGH (ref 0–32)
Albumin/Globulin Ratio: 1.7 (ref 1.1–2.5)
Albumin: 4 g/dL (ref 3.2–4.6)
BUN: 39 mg/dL — ABNORMAL HIGH (ref 10–36)
Calcium: 9.6 mg/dL (ref 8.6–10.2)
Creatinine, Ser: 1.14 mg/dL — ABNORMAL HIGH (ref 0.57–1.00)
GFR calc Af Amer: 49 mL/min/{1.73_m2} — ABNORMAL LOW (ref >59)
GFR calc non Af Amer: 42 mL/min/{1.73_m2} — ABNORMAL LOW (ref >59)
Globulin, Total: 2.4 g/dL (ref 1.5–4.5)
Glucose: 126 mg/dL — ABNORMAL HIGH (ref 65–99)
Potassium: 5.2 mmol/L (ref 3.5–5.2)
Total Protein: 6.4 g/dL (ref 6.0–8.5)

## 2013-04-22 NOTE — Progress Notes (Signed)
LMOM to return call.

## 2013-04-23 ENCOUNTER — Other Ambulatory Visit: Payer: Self-pay | Admitting: *Deleted

## 2013-04-23 MED ORDER — FERROUS SULFATE 325 (65 FE) MG PO TABS: 325.0000 mg | ORAL_TABLET | Freq: Every day | ORAL | Status: AC

## 2013-04-23 NOTE — Telephone Encounter (Signed)
Daughter stated that her mother has had swelling in her feet and getting short of breath going up stairs, [place] and have been on their feet longer. Told her once she gets home if the symptoms don't improve she needs to follow up and if it worsens she needs to go to Urgent care. Agreed.

## 2013-04-24 ENCOUNTER — Telehealth: Payer: Self-pay | Admitting: *Deleted

## 2013-04-24 NOTE — Telephone Encounter (Signed)
Daughter states that mother has SOB when she walk short distances, and wants to know if this is due to the medication that she was taking off. I called the daughter and informed them that Dr.Reed request that she be taken to urgent care for a oxygen saturation. Patient son-in-law states that they will probably let her rest this evening and take her in the morning.

## 2013-04-28 ENCOUNTER — Other Ambulatory Visit: Payer: Self-pay | Admitting: *Deleted

## 2013-04-28 MED ORDER — DIVALPROEX SODIUM 125 MG PO CPSP: ORAL_CAPSULE | ORAL | Status: DC

## 2013-05-01 ENCOUNTER — Inpatient Hospital Stay (HOSPITAL_COMMUNITY)
Admission: EM | Admit: 2013-05-01 | Discharge: 2013-05-08 | DRG: 871 | Disposition: A | Discharge status: Skilled Nursing Facility | Payer: Medicare Other | Attending: Internal Medicine | Admitting: Internal Medicine

## 2013-05-01 ENCOUNTER — Telehealth: Payer: Self-pay

## 2013-05-01 ENCOUNTER — Emergency Department (HOSPITAL_COMMUNITY): Payer: Medicare Other

## 2013-05-01 ENCOUNTER — Ambulatory Visit: Payer: Self-pay | Admitting: Nurse Practitioner

## 2013-05-01 ENCOUNTER — Encounter (HOSPITAL_COMMUNITY): Payer: Self-pay | Admitting: Emergency Medicine

## 2013-05-01 DIAGNOSIS — M7989 Other specified soft tissue disorders: Secondary | ICD-10-CM

## 2013-05-01 DIAGNOSIS — G309 Alzheimer's disease, unspecified: Secondary | ICD-10-CM | POA: Diagnosis present

## 2013-05-01 DIAGNOSIS — R791 Abnormal coagulation profile: Secondary | ICD-10-CM | POA: Diagnosis present

## 2013-05-01 DIAGNOSIS — R600 Localized edema: Secondary | ICD-10-CM

## 2013-05-01 DIAGNOSIS — Z66 Do not resuscitate: Secondary | ICD-10-CM | POA: Diagnosis present

## 2013-05-01 DIAGNOSIS — M1990 Unspecified osteoarthritis, unspecified site: Secondary | ICD-10-CM | POA: Diagnosis present

## 2013-05-01 DIAGNOSIS — I350 Nonrheumatic aortic (valve) stenosis: Secondary | ICD-10-CM

## 2013-05-01 DIAGNOSIS — B351 Tinea unguium: Secondary | ICD-10-CM

## 2013-05-01 DIAGNOSIS — I2699 Other pulmonary embolism without acute cor pulmonale: Secondary | ICD-10-CM

## 2013-05-01 DIAGNOSIS — I5031 Acute diastolic (congestive) heart failure: Secondary | ICD-10-CM | POA: Diagnosis present

## 2013-05-01 DIAGNOSIS — Z515 Encounter for palliative care: Secondary | ICD-10-CM

## 2013-05-01 DIAGNOSIS — I4891 Unspecified atrial fibrillation: Secondary | ICD-10-CM | POA: Diagnosis present

## 2013-05-01 DIAGNOSIS — R0600 Dyspnea, unspecified: Secondary | ICD-10-CM

## 2013-05-01 DIAGNOSIS — D72829 Elevated white blood cell count, unspecified: Secondary | ICD-10-CM

## 2013-05-01 DIAGNOSIS — Z86711 Personal history of pulmonary embolism: Secondary | ICD-10-CM

## 2013-05-01 DIAGNOSIS — E7800 Pure hypercholesterolemia, unspecified: Secondary | ICD-10-CM | POA: Diagnosis present

## 2013-05-01 DIAGNOSIS — R609 Edema, unspecified: Secondary | ICD-10-CM

## 2013-05-01 DIAGNOSIS — A419 Sepsis, unspecified organism: Principal | ICD-10-CM | POA: Diagnosis present

## 2013-05-01 DIAGNOSIS — R531 Weakness: Secondary | ICD-10-CM

## 2013-05-01 DIAGNOSIS — D473 Essential (hemorrhagic) thrombocythemia: Secondary | ICD-10-CM

## 2013-05-01 DIAGNOSIS — F0390 Unspecified dementia without behavioral disturbance: Secondary | ICD-10-CM | POA: Diagnosis present

## 2013-05-01 DIAGNOSIS — L02419 Cutaneous abscess of limb, unspecified: Secondary | ICD-10-CM | POA: Diagnosis present

## 2013-05-01 DIAGNOSIS — R6510 Systemic inflammatory response syndrome (SIRS) of non-infectious origin without acute organ dysfunction: Secondary | ICD-10-CM

## 2013-05-01 DIAGNOSIS — L0390 Cellulitis, unspecified: Secondary | ICD-10-CM

## 2013-05-01 DIAGNOSIS — I5033 Acute on chronic diastolic (congestive) heart failure: Secondary | ICD-10-CM | POA: Diagnosis present

## 2013-05-01 DIAGNOSIS — I10 Essential (primary) hypertension: Secondary | ICD-10-CM | POA: Diagnosis present

## 2013-05-01 DIAGNOSIS — I359 Nonrheumatic aortic valve disorder, unspecified: Secondary | ICD-10-CM | POA: Diagnosis present

## 2013-05-01 DIAGNOSIS — F0280 Dementia in other diseases classified elsewhere without behavioral disturbance: Secondary | ICD-10-CM | POA: Diagnosis present

## 2013-05-01 DIAGNOSIS — R748 Abnormal levels of other serum enzymes: Secondary | ICD-10-CM

## 2013-05-01 DIAGNOSIS — L03119 Cellulitis of unspecified part of limb: Secondary | ICD-10-CM | POA: Diagnosis present

## 2013-05-01 DIAGNOSIS — I509 Heart failure, unspecified: Secondary | ICD-10-CM | POA: Diagnosis present

## 2013-05-01 HISTORY — DX: Unspecified urinary incontinence: R32

## 2013-05-01 HISTORY — DX: Shortness of breath: R06.02

## 2013-05-01 HISTORY — DX: Acute embolism and thrombosis of unspecified deep veins of unspecified lower extremity: I82.409

## 2013-05-01 HISTORY — DX: Other pulmonary embolism without acute cor pulmonale: I26.99

## 2013-05-01 LAB — HEPATIC FUNCTION PANEL
AST: 47 U/L — ABNORMAL HIGH (ref 0–37)
Albumin: 3.1 g/dL — ABNORMAL LOW (ref 3.5–5.2)
Alkaline Phosphatase: 116 U/L (ref 39–117)
Bilirubin, Direct: 0.3 mg/dL (ref 0.0–0.3)
Indirect Bilirubin: 0.5 mg/dL (ref 0.3–0.9)
Total Bilirubin: 0.8 mg/dL (ref 0.3–1.2)

## 2013-05-01 LAB — BASIC METABOLIC PANEL
BUN: 26 mg/dL — ABNORMAL HIGH (ref 6–23)
Calcium: 9.1 mg/dL (ref 8.4–10.5)
Creatinine, Ser: 0.89 mg/dL (ref 0.50–1.10)
GFR calc Af Amer: 64 mL/min — ABNORMAL LOW (ref >90)
Glucose, Bld: 98 mg/dL (ref 70–99)
Potassium: 4.1 mEq/L (ref 3.5–5.1)

## 2013-05-01 LAB — POCT I-STAT TROPONIN I: Troponin i, poc: 0.05 ng/mL (ref 0.00–0.08)

## 2013-05-01 LAB — PROTIME-INR: INR: 3.51 — ABNORMAL HIGH (ref 0.00–1.49)

## 2013-05-01 LAB — PRO B NATRIURETIC PEPTIDE: Pro B Natriuretic peptide (BNP): 12469 pg/mL — ABNORMAL HIGH (ref 0–450)

## 2013-05-01 LAB — CBC
HCT: 37 % (ref 36.0–46.0)
MCH: 22.9 pg — ABNORMAL LOW (ref 26.0–34.0)
MCHC: 31.1 g/dL (ref 30.0–36.0)
MCV: 73.6 fL — ABNORMAL LOW (ref 78.0–100.0)
Platelets: 441 10*3/uL — ABNORMAL HIGH (ref 150–400)
RDW: 22.1 % — ABNORMAL HIGH (ref 11.5–15.5)

## 2013-05-01 MED ORDER — CHLORHEXIDINE GLUCONATE 0.12 % MT SOLN
15.0000 mL | Freq: Two times a day (BID) | OROMUCOSAL | Status: DC
  Administered 2013-05-03 – 2013-05-08 (×11): 15 mL via OROMUCOSAL
  Filled 2013-05-01 (×15): qty 15

## 2013-05-01 MED ORDER — METOPROLOL TARTRATE 12.5 MG HALF TABLET
12.5000 mg | ORAL_TABLET | Freq: Two times a day (BID) | ORAL | Status: DC
  Administered 2013-05-01 – 2013-05-05 (×9): 12.5 mg via ORAL
  Filled 2013-05-01 (×11): qty 1

## 2013-05-01 MED ORDER — FUROSEMIDE 10 MG/ML IJ SOLN
40.0000 mg | Freq: Two times a day (BID) | INTRAMUSCULAR | Status: DC
  Administered 2013-05-01 – 2013-05-02 (×3): 40 mg via INTRAVENOUS
  Filled 2013-05-01 (×4): qty 4

## 2013-05-01 MED ORDER — DONEPEZIL HCL 10 MG PO TABS
10.0000 mg | ORAL_TABLET | Freq: Every day | ORAL | Status: DC
  Administered 2013-05-01 – 2013-05-07 (×7): 10 mg via ORAL
  Filled 2013-05-01 (×9): qty 1

## 2013-05-01 MED ORDER — VANCOMYCIN HCL IN DEXTROSE 1-5 GM/200ML-% IV SOLN
1000.0000 mg | INTRAVENOUS | Status: DC
  Administered 2013-05-01 – 2013-05-02 (×2): 1000 mg via INTRAVENOUS
  Filled 2013-05-01 (×4): qty 200

## 2013-05-01 MED ORDER — CLINDAMYCIN PHOSPHATE 600 MG/50ML IV SOLN
600.0000 mg | Freq: Once | INTRAVENOUS | Status: AC
  Administered 2013-05-01: 600 mg via INTRAVENOUS
  Filled 2013-05-01: qty 50

## 2013-05-01 MED ORDER — ACETAMINOPHEN 325 MG PO TABS
650.0000 mg | ORAL_TABLET | ORAL | Status: DC | PRN
  Filled 2013-05-01: qty 2

## 2013-05-01 MED ORDER — FUROSEMIDE 10 MG/ML IJ SOLN
40.0000 mg | Freq: Once | INTRAMUSCULAR | Status: AC
  Administered 2013-05-01: 40 mg via INTRAVENOUS
  Filled 2013-05-01: qty 4

## 2013-05-01 MED ORDER — IOHEXOL 350 MG/ML SOLN
100.0000 mL | Freq: Once | INTRAVENOUS | Status: AC | PRN
  Administered 2013-05-01: 100 mL via INTRAVENOUS

## 2013-05-01 MED ORDER — DIVALPROEX SODIUM 125 MG PO CPSP
125.0000 mg | ORAL_CAPSULE | Freq: Every day | ORAL | Status: DC
  Administered 2013-05-02 – 2013-05-08 (×7): 125 mg via ORAL
  Filled 2013-05-01 (×7): qty 1

## 2013-05-01 MED ORDER — SODIUM CHLORIDE 0.9 % IV SOLN
250.0000 mL | INTRAVENOUS | Status: DC | PRN
  Administered 2013-05-03: 13:00:00 6 mL/h via INTRAVENOUS

## 2013-05-01 MED ORDER — SODIUM CHLORIDE 0.9 % IJ SOLN: 3.0000 mL | INTRAMUSCULAR | Status: DC | PRN

## 2013-05-01 MED ORDER — VITAMIN D3 25 MCG (1000 UNIT) PO TABS
1000.0000 [IU] | ORAL_TABLET | Freq: Every day | ORAL | Status: DC
  Administered 2013-05-02 – 2013-05-08 (×7): 1000 [IU] via ORAL
  Filled 2013-05-01 (×7): qty 1

## 2013-05-01 MED ORDER — ONDANSETRON HCL 4 MG/2ML IJ SOLN: 4.0000 mg | Freq: Four times a day (QID) | INTRAMUSCULAR | Status: DC | PRN

## 2013-05-01 MED ORDER — ADULT MULTIVITAMIN W/MINERALS CH
1.0000 | ORAL_TABLET | Freq: Every day | ORAL | Status: DC
  Administered 2013-05-02 – 2013-05-08 (×7): 1 via ORAL
  Filled 2013-05-01 (×7): qty 1

## 2013-05-01 MED ORDER — ASPIRIN EC 81 MG PO TBEC
81.0000 mg | DELAYED_RELEASE_TABLET | Freq: Every day | ORAL | Status: DC
  Administered 2013-05-01 – 2013-05-08 (×8): 81 mg via ORAL
  Filled 2013-05-01 (×8): qty 1

## 2013-05-01 MED ORDER — ENOXAPARIN SODIUM 40 MG/0.4ML ~~LOC~~ SOLN: 40.0000 mg | SUBCUTANEOUS | Status: DC

## 2013-05-01 MED ORDER — CALCIUM CARBONATE-VITAMIN D 500-200 MG-UNIT PO TABS
1.0000 | ORAL_TABLET | Freq: Two times a day (BID) | ORAL | Status: DC
  Administered 2013-05-01 – 2013-05-08 (×14): 1 via ORAL
  Filled 2013-05-01 (×15): qty 1

## 2013-05-01 MED ORDER — BIOTENE DRY MOUTH MT LIQD
15.0000 mL | Freq: Two times a day (BID) | OROMUCOSAL | Status: DC
  Administered 2013-05-02 – 2013-05-08 (×13): 15 mL via OROMUCOSAL

## 2013-05-01 MED ORDER — SODIUM CHLORIDE 0.9 % IJ SOLN
3.0000 mL | Freq: Two times a day (BID) | INTRAMUSCULAR | Status: DC
  Administered 2013-05-01 – 2013-05-06 (×7): 3 mL via INTRAVENOUS

## 2013-05-01 MED ORDER — MEMANTINE HCL 10 MG PO TABS
10.0000 mg | ORAL_TABLET | Freq: Two times a day (BID) | ORAL | Status: DC
  Administered 2013-05-01 – 2013-05-08 (×14): 10 mg via ORAL
  Filled 2013-05-01 (×15): qty 1

## 2013-05-01 MED ORDER — PIPERACILLIN-TAZOBACTAM 3.375 G IVPB
3.3750 g | Freq: Three times a day (TID) | INTRAVENOUS | Status: DC
  Administered 2013-05-01 – 2013-05-02 (×2): 3.375 g via INTRAVENOUS
  Filled 2013-05-01 (×4): qty 50

## 2013-05-01 MED ORDER — FERROUS SULFATE 325 (65 FE) MG PO TABS
325.0000 mg | ORAL_TABLET | Freq: Every day | ORAL | Status: DC
  Administered 2013-05-02 – 2013-05-08 (×7): 325 mg via ORAL
  Filled 2013-05-01 (×8): qty 1

## 2013-05-01 MED ORDER — INFLUENZA VAC SPLIT QUAD 0.5 ML IM SUSP
0.5000 mL | INTRAMUSCULAR | Status: AC
  Administered 2013-05-02: 0.5 mL via INTRAMUSCULAR
  Filled 2013-05-01: qty 0.5

## 2013-05-01 NOTE — ED Notes (Signed)
Called CT stated currently sent transport for patient.

## 2013-05-01 NOTE — Progress Notes (Signed)
Pt VSS, in NAD. Pt with multiple red spots/scabs to left flank and few on abdomen. MD notified of rash and concern for possible shingles. Pt complaining of no pain at these sites. MD on call examined pt and states pt not concerning for shingles at this time. New order to place patient on contact precautions due to wounds/rash until AM when MD rounds to re-assess. Pt daughter notified at bedside and pt placed on contact precautions. Baron Hamper, RN

## 2013-05-01 NOTE — Progress Notes (Signed)
VASCULAR LAB PRELIMINARY  PRELIMINARY  PRELIMINARY  PRELIMINARY  Bilateral lower extremity venous Dopplers completed.    Preliminary report:  There is no DVT or SVT noted in the bilateral lower extremities.  Pulsatile flow noted, consistent with fluid overload.  Ryleigh Esqueda, RVT 05/01/2013, 2:12 PM

## 2013-05-01 NOTE — ED Provider Notes (Signed)
Medical screening examination/treatment/procedure(s) were conducted as a shared visit with non-physician practitioner(s) and myself.  I personally evaluated the patient during the encounter.  EKG Interpretation   None        Doug Sou, MD 05/01/13 [phone]

## 2013-05-01 NOTE — Telephone Encounter (Signed)
Patient's daughter called back, symptoms worse, recommend patient be seen in Emergency Room, patient's daughter agreed and indicated as soon as patient came out of bathroom they were going to take her to [facility]

## 2013-05-01 NOTE — ED Notes (Signed)
Pt transported to vascular lab. 

## 2013-05-01 NOTE — ED Provider Notes (Signed)
Level V caveat patient demented patient offers no complaint times is asymptomatic. Her daughter reports that her right leg has been more swollen over approximately the past week. Patient denies any shortness of breath. On exam patient is alert cooperative. Lungs clear auscultation heart regular rate and rhythm right lower extremity with several pustular lesions approximately 35 mm in diameter with multiple reddish purple ecchymoses about the lower leg and hematoma a plastic golf ball sized at the calf left lower extremity without swelling or deformity or tenderness.  Doug Sou, MD 05/01/13 1345

## 2013-05-01 NOTE — ED Notes (Signed)
Pt with hx of dvt's to L leg.  R leg has been tx for cellulitis in the last week.  Last night daughter noticed dark purple swelling to R calf.  Pt also c/o sob x 1 week.  Recently removed from [facility] d/t gi bleed.

## 2013-05-01 NOTE — Progress Notes (Signed)
ANTIBIOTIC CONSULT NOTE - INITIAL  Pharmacy Consult for Vancomycin and Zosyn Indication: pneumonia and cellulitis  No Known Allergies  Patient Measurements: Height: 5\' 5"  (165.1 cm) Weight: 144 lb 6.4 oz (65.5 kg) (scaleB) IBW/kg (Calculated) : 57  Vital Signs: Temp: 97.5 F (36.4 C) (10/30 1935) Temp src: Oral (10/30 1935) BP: 114/63 mmHg (10/30 1935) Pulse Rate: 90 (10/30 1935) Intake/Output from previous day:   Intake/Output from this shift:    Labs:  Recent Labs  05/01/13 1237  WBC 21.5*  HGB 11.5*  PLT 441*  CREATININE 0.89   Estimated Creatinine Clearance: 37.8 ml/min (by C-G formula based on Cr of 0.89). No results found for this basename: VANCOTROUGH, VANCOPEAK, VANCORANDOM, GENTTROUGH, GENTPEAK, GENTRANDOM, TOBRATROUGH, TOBRAPEAK, TOBRARND, AMIKACINPEAK, AMIKACINTROU, AMIKACIN,  in the last 72 hours   Microbiology: No results found for this or any previous visit (from the past 720 hour(s)).  Medical History: Past Medical History  Diagnosis Date  . Dementia   . Hypertension   . High cholesterol   . Essential thrombocythemia   . Onychia and paronychia of toe   . Other specified disease of white blood cells   . Abnormality of gait   . Hyperpotassemia   . Urinary frequency   . Alzheimer's disease   . Alveolitis of jaw   . Osteoporosis, unspecified   . Dementia in conditions classified elsewhere without behavioral disturbance(294.10)   . Hemorrhage of gastrointestinal tract, unspecified   . Osteoarthrosis, unspecified whether generalized or localized, unspecified site   . Memory loss   . Unspecified urinary incontinence   . CHF (congestive heart failure)     Medications:  Anti-infectives   Start     Dose/Rate Route Frequency Ordered Stop   05/01/13 1445  clindamycin (CLEOCIN) IVPB 600 mg     600 mg 100 mL/hr over 30 Minutes Intravenous  Once 05/01/13 1436 05/01/13 1615     Assessment: 79 year old female admitted with shortness of breath and  lower extremity swelling.  She is to begin empiric antibiotic therapy with Vancomycin and Zosyn.  Her renal function is appropriate for her advanced age.  Goal of Therapy:  Vancomycin trough level 15-20 mcg/ml  Plan:  Vancomycin 1gm IV q24h Zosyn 3.375gm IV q8h extended infusion Monitor renal function closely Check Vancomycin trough at steady state  Estella Husk, Pharm.D., BCPS, AAHIVP Clinical Pharmacist Phone: [phone] or [phone] 05/01/2013, 7:59 PM

## 2013-05-01 NOTE — Telephone Encounter (Signed)
Patient seen Caroline Schroeder twice within the last month, blood thinner was removed due to internal bleeding. Patient with SOB with small amounts of activity. Patient also with drainage from legs- told cellulitis. Patient's daughter is concerned and would like for her mother to be re-evaluated.   Patient was scheduled today to see [name], NP @ 1:30, patients daughter was unable to make morning appointment.

## 2013-05-01 NOTE — ED Notes (Addendum)
Spoke with EDP does not want blood cultures ordered prior to antibiotic administration.

## 2013-05-01 NOTE — ED Provider Notes (Signed)
CSN: [id number]     Arrival date & time 05/01/13  1209 History   First MD Initiated Contact with Patient 05/01/13 1236     Chief Complaint  Patient presents with  . Leg Swelling    hx of dvt and PE  . Shortness of Breath   (Consider location/radiation/quality/duration/timing/severity/associated sxs/prior Treatment) HPI  Caroline Schroeder is a 79 y.o. female past medical history significant for Alzheimer's dementia, hypertension and high cholesterol, lives at home with her daughter who supplies most of the history. Level V caveat secondary to dementia. Patient is brought in today for increasing dyspnea on exertion, right lower extremity swelling and hemorrhagic blister noticed this a.m. Patient was diagnosed with bilateral pulmonary embolism and  left lower extremity DVT  in June, patient was started on Xaralto, patient had a GI bleed and xarelto was DC'd ~3 weeks ago at roughly the same time, there was an increased edema to the right lower extremity. Was seen by her primary care physician who was treating her for cellulitis with doxycycline on the ninth, she finished the ten-day course proximally week ago. Swelling has not improved in fact it has worsened since that time. Daughter reports that she has had no bright red blood per time but stool remains melanotic.  Past Medical History  Diagnosis Date  . Dementia   . Hypertension   . High cholesterol   . Essential thrombocythemia   . Onychia and paronychia of toe   . Other specified disease of white blood cells   . Abnormality of gait   . Hyperpotassemia   . Urinary frequency   . Alzheimer's disease   . Alveolitis of jaw   . Osteoporosis, unspecified   . Dementia in conditions classified elsewhere without behavioral disturbance(294.10)   . Hemorrhage of gastrointestinal tract, unspecified   . Osteoarthrosis, unspecified whether generalized or localized, unspecified site   . Memory loss   . Unspecified urinary incontinence   . CHF  (congestive heart failure)    Past Surgical History  Procedure Laterality Date  . Appendectomy     Family History  Problem Relation Age of Onset  . Stroke Brother   . Cancer Mother     colon  . Heart attack Father    History  Substance Use Topics  . Smoking status: Never Smoker   . Smokeless tobacco: Not on file  . Alcohol Use: Yes   OB History   Grav Para Term Preterm Abortions TAB SAB Ect Mult Living                 Review of Systems  Unable to perform ROS: Dementia    Allergies  Review of patient's allergies indicates no known allergies.  Home Medications   Current Outpatient Rx  Name  Route  Sig  Dispense  Refill  . amLODipine (NORVASC) 5 MG tablet   Oral   Take 5 mg by mouth daily.         Marland Kitchen aspirin EC 81 MG tablet   Oral   Take 81 mg by mouth every evening.         . calcium-vitamin D (OSCAL WITH D) 500-200 MG-UNIT per tablet   Oral   Take 1 tablet by mouth 2 (two) times daily.         . cholecalciferol (VITAMIN D) 1000 UNITS tablet   Oral   Take 1,000 Units by mouth daily.         . divalproex (DEPAKOTE SPRINKLE) 125 MG capsule  Oral   Take 125 mg by mouth daily.         Marland Kitchen donepezil (ARICEPT) 10 MG tablet   Oral   Take 10 mg by mouth at bedtime.         . ferrous sulfate 325 (65 FE) MG tablet   Oral   Take 1 tablet (325 mg total) by mouth daily with breakfast.   30 tablet   3   . memantine (NAMENDA) 10 MG tablet   Oral   Take 10 mg by mouth 2 (two) times daily.         . Multiple Vitamin (MULTIVITAMIN WITH MINERALS) TABS   Oral   Take 1 tablet by mouth daily.         . Rivaroxaban (XARELTO) 20 MG TABS tablet   Oral   Take 20 mg by mouth once.          BP 112/70  Pulse 66  Temp(Src) 98.2 F (36.8 C) (Oral)  Resp 16  Ht 5\' 5"  (1.651 m)  Wt 149 lb 9.6 oz (67.858 kg)  BMI 24.89 kg/m2  SpO2 95% Physical Exam  Nursing note and vitals reviewed. Constitutional: She is oriented to person, place, and time. She  appears well-developed and well-nourished. No distress.  HENT:  Head: Normocephalic.  Mouth/Throat: Oropharynx is clear and moist.  Eyes: Conjunctivae and EOM are normal. Pupils are equal, round, and reactive to light.  Neck: Normal range of motion. No JVD present.  Cardiovascular: Normal rate.   Murmur heard. Pulmonary/Chest: Effort normal and breath sounds normal. No stridor. No respiratory distress. She has no wheezes. She has no rales. She exhibits no tenderness.  Abdominal: Soft. Bowel sounds are normal. She exhibits no distension and no mass. There is no tenderness. There is no rebound and no guarding.  Musculoskeletal: Normal range of motion. She exhibits edema and tenderness.  3+ pitting edema to bilateral lower extremities, there is erythema, induration, clear weeping and areas of purulent drainage distally; diffusely tenderness to palpation. Patient has 2 x 3 cm hemorrhagic blister in the calf of the right posterior thigh.  Neurological: She is alert and oriented to person, place, and time.  Psychiatric: She has a normal mood and affect.    ED Course  Procedures (including critical care time) Labs Review Labs Reviewed  CBC - Abnormal; Notable for the following:    WBC 21.5 (*)    Hemoglobin 11.5 (*)    MCV 73.6 (*)    MCH 22.9 (*)    RDW 22.1 (*)    Platelets 441 (*)    All other components within normal limits  BASIC METABOLIC PANEL - Abnormal; Notable for the following:    BUN 26 (*)    GFR calc non Af Amer 55 (*)    GFR calc Af Amer 64 (*)    All other components within normal limits  PRO B NATRIURETIC PEPTIDE - Abnormal; Notable for the following:    Pro B Natriuretic peptide (BNP) 12469.0 (*)    All other components within normal limits  HEPATIC FUNCTION PANEL - Abnormal; Notable for the following:    Albumin 3.1 (*)    AST 47 (*)    ALT 41 (*)    All other components within normal limits  PROTIME-INR - Abnormal; Notable for the following:    Prothrombin Time  33.9 (*)    INR 3.51 (*)    All other components within normal limits  POCT I-STAT TROPONIN I  Imaging Review Dg Chest 2 View  05/01/2013   CLINICAL DATA:  Shortness of breath. History of pulmonary embolism. Swollen legs. Deep venous thrombosis.  EXAM: CHEST  2 VIEW  COMPARISON:  CHEST x-ray 12/04/2012. Chest CT 12/04/2012.  FINDINGS: Bibasilar opacities may reflect areas of atelectasis and/or consolidation. Moderate right and small left effusion. Peripheral ill-defined opacity in the left mid lung. Cephalization of the pulmonary vasculature, without frank pulmonary edema. Mild cardiomegaly. Upper mediastinal contours are unremarkable. Atherosclerosis in the thoracic aorta. Old compression fracture of a mid thoracic vertebral body (likely T8), unchanged.  IMPRESSION: 1. Bibasilar areas of atelectasis and/or consolidation. In the appropriate clinical setting, this could be related to sequela of aspiration, developing infection, or multifocal pulmonary infarctions. Clinical correlation is recommended. Additionally, there is some ill-defined airspace disease in the left mid lung. 2. Moderate right and small left pleural effusions. 3. Cardiomegaly with pulmonary venous congestion, but no frank pulmonary edema. 4. Atherosclerosis.   Electronically Signed   By: Trudie Reed M.D.   On: 05/01/2013 13:35    EKG Interpretation   None       Date: 05/01/2013  Rate: 96  Rhythm: normal sinus rhythm and premature ventricular contractions (PVC)  QRS Axis: left  Intervals: QT prolonged  ST/T Wave abnormalities: normal  Conduction Disutrbances:none  Narrative Interpretation:   Old EKG Reviewed: New mild QTC prolongation with PVCs as compared to EKG in June 5 of 2014.   MDM   1. Edema of left lower extremity   2. DOE (dyspnea on exertion)   3. Peripheral edema   4. Cellulitis      Filed Vitals:   05/01/13 1306 05/01/13 1345 05/01/13 1500 05/01/13 1515  BP: 129/71 109/75 111/73 112/70  Pulse:   26 97 66  Temp:      TempSrc:      Resp: 25 18 16 16   Height:      Weight:      SpO2:  96% 96% 95%     Caroline Schroeder is a 79 y.o. female dementia brought from home where she lives with her daughter who is her primary caregiver complaining of increasing dyspnea on exertion and swelling to the right lower extremity. Patient was diagnosed with left lower extremity DVT and multifocal bilateral pulmonary embolism in June. Patient was placed on xaralto, which was stopped approximately 3 weeks ago secondary to GI bleed. It was about this time when the right lower extremity started swelling. Patient was started on a 10 day course of doxycycline for presumed cellulitis which was ineffective. Swelling has continued to increase since that time.  Lower extremity venous Doppler shows that there are no clots bilaterally. Patient's chest x-ray is cardiomegaly with pulmonary venous congestion, no frank pulmonary edema. Patient's BNP is elevated at 12,000, however there is no prior study to compare to. Patient has a significant leukocytosis at 21. Renal function within normal limits. EKG is nonischemic, shows a limp and QTc and troponin is negative. I'm going to start patient on clindamycin for presumed lower extremity cellulitis  Case signed out to Resident Dr. Craige Cotta at shift change. Plan is to f/u CT to r/o PE and admit to [facility] for outpatient failure of cellulitis treatment and CHF.   Medications  clindamycin (CLEOCIN) IVPB 600 mg (600 mg Intravenous New Bag/Given 05/01/13 1534)  furosemide (LASIX) injection 40 mg (40 mg Intravenous Given 05/01/13 1527)    Note: Portions of this report may have been transcribed using voice recognition software. Every effort  was made to ensure accuracy; however, inadvertent computerized transcription errors may be present      Wynetta Emery, PA-C 05/01/13 1617

## 2013-05-01 NOTE — H&P (Addendum)
[facility] History and Physical  Caroline Schroeder ZOX:[id number] DOB: June 25, 1921 DOA: 05/01/2013  Referring physician: ED PCP: Bufford Spikes, DO   Chief Complaint:  Shortness of breath for 7-10 days  Lower extremity swelling for 3 weeks  HPI:  79 year old female with advanced dementia who was on xarelto for B/l PE diagnosed in June of this year was brought in by her daughter for dyspnea on exertion for 7-10 days. Patient was also noted to have increased swelling in her legs R>L  Since 3 weeks and  she was seen by her PCP about 2 weeks back and given a 10 day course of oral doxycycline. During that time patient also had BRBPR for which her xarelto was held. Hemoglobin was stable. Patient did not have further symptoms.   for last 7-10 days patient was noted to have increased dyspnea on exertion initially to climb stairs followed by difficulty walking on flat surface as well. Patient had baseline he is independently mobile. Bucher also noticed increased leg swelling with weeping of the legs and  blisters over the right leg for past 3-4 days. She denies any fever, chills, nausea, vomiting, complained of chest pain, palpitations, abdominal pain, dysuria, diarrhea or loss of appetite. Denies change in metal stratus. At baseline patient is quite demented and oriented to self only.   Course in the ED. Given significant swelling in her legs a Doppler of LE was done which was negative for PE. The patient was noted to have significant leukocytosis with markedly elevated pro BNP of 12k. Chest x-ray showed bibasilar atelectasis versus consolidation. Also showed moderate bilateral pleural effusions with pulmonary vascular congestion. A CT angiogram of the chest was done with constant for PE which was negative. Patient given a dose of IV clindamycin and IV Lasix and [facility] called for admission to telemetry.  Review of Systems:  As outlined in history of present illness. ROS  limited due to the  severe dementia    Past Medical History  Diagnosis Date  . Dementia   . Hypertension   . High cholesterol   . Essential thrombocythemia   . Onychia and paronychia of toe   . Other specified disease of white blood cells   . Abnormality of gait   . Hyperpotassemia   . Urinary frequency   . Alzheimer's disease   . Alveolitis of jaw   . Osteoporosis, unspecified   . Dementia in conditions classified elsewhere without behavioral disturbance(294.10)   . Hemorrhage of gastrointestinal tract, unspecified   . Osteoarthrosis, unspecified whether generalized or localized, unspecified site   . Memory loss   . Unspecified urinary incontinence   . CHF (congestive heart failure)    Past Surgical History  Procedure Laterality Date  . Appendectomy     Social History:  reports that she has never smoked. She does not have any smokeless tobacco history on file. She reports that she drinks alcohol. She reports that she does not use illicit drugs.  No Known Allergies  Family History  Problem Relation Age of Onset  . Stroke Brother   . Cancer Mother     colon  . Heart attack Father     Prior to Admission medications   Medication Sig Start Date End Date Taking? Authorizing Provider  amLODipine (NORVASC) 5 MG tablet Take 5 mg by mouth daily.   Yes Historical Provider, MD  aspirin EC 81 MG tablet Take 81 mg by mouth every evening.   Yes Historical Provider, MD  calcium-vitamin D Ruthell Rummage  WITH D) 500-200 MG-UNIT per tablet Take 1 tablet by mouth 2 (two) times daily.   Yes Historical Provider, MD  cholecalciferol (VITAMIN D) 1000 UNITS tablet Take 1,000 Units by mouth daily.   Yes Historical Provider, MD  divalproex (DEPAKOTE SPRINKLE) 125 MG capsule Take 125 mg by mouth daily.   Yes Historical Provider, MD  donepezil (ARICEPT) 10 MG tablet Take 10 mg by mouth at bedtime.   Yes Historical Provider, MD  ferrous sulfate 325 (65 FE) MG tablet Take 1 tablet (325 mg total) by mouth daily with breakfast.  04/23/13  Yes Claudie Revering, NP  memantine (NAMENDA) 10 MG tablet Take 10 mg by mouth 2 (two) times daily.   Yes Historical Provider, MD  Multiple Vitamin (MULTIVITAMIN WITH MINERALS) TABS Take 1 tablet by mouth daily.   Yes Historical Provider, MD  Rivaroxaban (XARELTO) 20 MG TABS tablet Take 20 mg by mouth once.    Historical Provider, MD    Physical Exam:  Filed Vitals:   05/01/13 1630 05/01/13 1730 05/01/13 1745 05/01/13 1800  BP: 130/85 132/78 123/68 105/84  Pulse: 63  29   Temp:      TempSrc:      Resp: 22 24 27 24   Height:      Weight:      SpO2: 95% 94% 93%     Constitutional: Vital signs reviewed.  Patient is an elderly female lying in bed in no acute distress HEENT: No pallor, no icterus, moist oral mucosa, no JVD Cardiovascular:  S1 and S2 irregular, no murmurs rub or gallop Chest: Diminished breath sound at the lung bases R>L Abdominal: Soft. Non-tender, non-distended, bowel sounds are normal, no masses, organomegaly, or guarding present.   Ext: b/l erythem with pitting weeping edema.R >L. Area of blisters over right leg with some warmth . No fluctuation or abscess noted clinically. Non tender. CNS: AAOX1, non fcoal  Labs on Admission:  Basic Metabolic Panel:  Recent Labs Lab 05/01/13 1237  NA 140  K 4.1  CL 104  CO2 21  GLUCOSE 98  BUN 26*  CREATININE 0.89  CALCIUM 9.1   Liver Function Tests:  Recent Labs Lab 05/01/13 1237  AST 47*  ALT 41*  ALKPHOS 116  BILITOT 0.8  PROT 6.4  ALBUMIN 3.1*   No results found for this basename: LIPASE, AMYLASE,  in the last 168 hours No results found for this basename: AMMONIA,  in the last 168 hours CBC:  Recent Labs Lab 05/01/13 1237  WBC 21.5*  HGB 11.5*  HCT 37.0  MCV 73.6*  PLT 441*   Cardiac Enzymes: No results found for this basename: CKTOTAL, CKMB, CKMBINDEX, TROPONINI,  in the last 168 hours BNP: No components found with this basename: POCBNP,  CBG: No results found for this basename:  GLUCAP,  in the last 168 hours  Radiological Exams on Admission: Dg Chest 2 View  05/01/2013   CLINICAL DATA:  Shortness of breath. History of pulmonary embolism. Swollen legs. Deep venous thrombosis.  EXAM: CHEST  2 VIEW  COMPARISON:  CHEST x-ray 12/04/2012. Chest CT 12/04/2012.  FINDINGS: Bibasilar opacities may reflect areas of atelectasis and/or consolidation. Moderate right and small left effusion. Peripheral ill-defined opacity in the left mid lung. Cephalization of the pulmonary vasculature, without frank pulmonary edema. Mild cardiomegaly. Upper mediastinal contours are unremarkable. Atherosclerosis in the thoracic aorta. Old compression fracture of a mid thoracic vertebral body (likely T8), unchanged.  IMPRESSION: 1. Bibasilar areas of atelectasis and/or consolidation. In the  appropriate clinical setting, this could be related to sequela of aspiration, developing infection, or multifocal pulmonary infarctions. Clinical correlation is recommended. Additionally, there is some ill-defined airspace disease in the left mid lung. 2. Moderate right and small left pleural effusions. 3. Cardiomegaly with pulmonary venous congestion, but no frank pulmonary edema. 4. Atherosclerosis.   Electronically Signed   By: Trudie Reed M.D.   On: 05/01/2013 13:35   Ct Angio Chest Pe W/cm &/or Wo Cm  05/01/2013   CLINICAL DATA:  Lower edema swelling ; shortness of breath ; history of previous pulmonary emboli  EXAM: CT ANGIOGRAPHY CHEST WITH CONTRAST  TECHNIQUE: Multidetector CT imaging of the chest was performed using the standard protocol during bolus administration of intravenous contrast. Multiplanar CT image reconstructions including MIPs were obtained to evaluate the vascular anatomy.  CONTRAST:  OMNIPAQUE IOHEXOL 350 MG/ML SOLN  COMPARISON:  Chest radiograph May 01, 2013 and chest CT angiogram December 04, 2012  FINDINGS: There are no but demonstrable pulmonary emboli. There is no thoracic aortic  aneurysm. There is atherosclerotic change throughout the aorta.  There are moderate pleural effusions bilaterally, larger on the right than on the left. There is cardiomegaly. Pericardium is not thickened.  There are areas of patchy airspace opacity in both lower lobes as well as in the inferior lingula and right middle lobe medially. There is mild interstitial edema in the bases is well.  There is no appreciable thoracic adenopathy.  The the limited visualization the upper abdomen shows no abnormality.  There are no blastic or lytic bone lesions. There is anterior wedging of the T10 vertebral body, stable from prior study. Thyroid appears unremarkable. The  Review of the MIP images confirms the above findings.  IMPRESSION: Congestive heart failure. Areas of superimposed pneumonia in the lingula and right upper lobe cannot be excluded. No demonstrable pulmonary embolus.   Electronically Signed   By: Bretta Bang M.D.   On: 05/01/2013 16:54    EKG: Normal sinus rhythm with PVCs, no ST-T changes  Assessment/Plan Principal Problem:   Acute exacerbation of CHF (congestive heart failure) New onset  underlying etiology could be new onset A fib and infection ( cellulitis/ ? Pneumonia) Admit to telemetry. Strict I/O. Place foley catheter if UOP monitoring difficult. -daily weight  ordered 2D echo. Check TSH   Active Problems: Cellulitis of legs Given leucocytosis, tachycardiA and mild tachypnea meets criteria for SIRS.  failed outpt treatment. Will place on IV vancomycin empirically.    New onset a-fib continue ASA. Placed on low dose BB. Check TSH and 2D echo.    Dementia continue home meds  ? Pneumonia  new infiltrates seen on CXR and CT angio chest. Patient afebrile .Will place her on empiric zosyn  Recent hx of b/l PE  was on xarelto since June but held recently after c/o melena.  DVT prophylaxis  INR suprathetrapeutic  Diet: cardiac  Code Status: DNR as per daughter Family  Communication:  daughter at bedside  Disposition Plan: inpatient   Eddie North [facility] Pager [phone]   If 7PM-7AM, please contact night-coverage [email or web address] Password [email or web address] 05/01/2013, 6:42 PM  Total time spent: 70 minutes

## 2013-05-01 NOTE — ED Notes (Signed)
Patient transported to X-ray 

## 2013-05-01 NOTE — ED Provider Notes (Signed)
  Physical Exam  BP 112/70  Pulse 66  Temp(Src) 98.2 F (36.8 C) (Oral)  Resp 16  Ht 5\' 5"  (1.651 m)  Wt 149 lb 9.6 oz (67.858 kg)  BMI 24.89 kg/m2  SpO2 95%  Physical Exam  ED Course  Procedures  MDM Assumed care from [facility] PA-C at 1600 please see her note for HPI and details of care until that point.  Briefly this is a 79 year old female with past medical history of dementia and treatment for a DVT and PE with cerebral tone. Results was DC'd approximately 3 weeks ago secondary to GI bleed. Since then she has developed worse shortness of breath and right lower extremity edema. With her primary care physician who treated with doxycycline. Labs here demonstrated a new onset CHF and concerns for a cellulitis which is felt outpatient treatment. She's been treated with clindamycin we're waiting a CTA PE study. CT was negative for PE. Patient was admitted to the hospitalist service for CHF and cellulitis. Images reviewed by myself and considered and medical decision making. Imaging was interpreted by Radiology.  Care was discussed with my attending.      Bethann Berkshire, MD 05/02/13 [phone]

## 2013-05-02 DIAGNOSIS — F0390 Unspecified dementia without behavioral disturbance: Secondary | ICD-10-CM

## 2013-05-02 DIAGNOSIS — R609 Edema, unspecified: Secondary | ICD-10-CM

## 2013-05-02 DIAGNOSIS — L0291 Cutaneous abscess, unspecified: Secondary | ICD-10-CM

## 2013-05-02 LAB — CBC WITH DIFFERENTIAL/PLATELET
Basophils Relative: 0 % (ref 0–1)
Eosinophils Absolute: 0.2 10*3/uL (ref 0.0–0.7)
Eosinophils Relative: 1 % (ref 0–5)
HCT: 33.1 % — ABNORMAL LOW (ref 36.0–46.0)
Hemoglobin: 10.3 g/dL — ABNORMAL LOW (ref 12.0–15.0)
MCH: 22.7 pg — ABNORMAL LOW (ref 26.0–34.0)
MCHC: 31.1 g/dL (ref 30.0–36.0)
MCV: 72.9 fL — ABNORMAL LOW (ref 78.0–100.0)
Monocytes Absolute: 1.1 10*3/uL — ABNORMAL HIGH (ref 0.1–1.0)
Monocytes Relative: 6 % (ref 3–12)
Neutro Abs: 15.4 10*3/uL — ABNORMAL HIGH (ref 1.7–7.7)
Neutrophils Relative %: 86 % — ABNORMAL HIGH (ref 43–77)
RBC: 4.54 MIL/uL (ref 3.87–5.11)

## 2013-05-02 LAB — BASIC METABOLIC PANEL
BUN: 24 mg/dL — ABNORMAL HIGH (ref 6–23)
CO2: 26 mEq/L (ref 19–32)
Calcium: 9 mg/dL (ref 8.4–10.5)
Chloride: 102 mEq/L (ref 96–112)
Creatinine, Ser: 1.06 mg/dL (ref 0.50–1.10)
Glucose, Bld: 93 mg/dL (ref 70–99)

## 2013-05-02 LAB — TSH: TSH: 2.81 u[IU]/mL (ref 0.350–4.500)

## 2013-05-02 NOTE — Plan of Care (Signed)
Problem: Phase I Progression Outcomes Goal: EF % per last Echo/documented,Core Reminder form on chart Outcome: Completed/Met Date Met:  05/02/13 60-65%

## 2013-05-02 NOTE — Progress Notes (Signed)
Nutrition Brief Note  Patient identified on the Malnutrition Screening Tool (MST) Report for recent weight lost without trying.  Per readings below, patient's weight has trended up.  Wt Readings from Last 15 Encounters:  05/02/13 141 lb 1.5 oz (64 kg)  04/17/13 136 lb 12.8 oz (62.052 kg)  04/10/13 131 lb 12.8 oz (59.784 kg)  04/08/13 135 lb (61.236 kg)  12/12/12 135 lb 6.4 oz (61.417 kg)  12/05/12 125 lb 3.5 oz (56.8 kg)    Body mass index is 23.48 kg/(m^2). Patient meets criteria for Normal based on current BMI.   Current diet order is Heart Healthy, patient is consuming approximately 100% of meals at this time. Labs and medications reviewed.   No nutrition interventions warranted at this time. If nutrition issues arise, please consult RD.   Maureen Chatters, RD, LDN Pager #: [phone] After-Hours Pager #: [phone]

## 2013-05-02 NOTE — Progress Notes (Signed)
[facility] PROGRESS NOTE Interim History: 79 year old female with advanced dementia who was on xarelto for B/l PE diagnosed in June of this year was brought in by her daughter for dyspnea on exertion for 7-10 days. Patient was also noted to have increased swelling in her legs R>L Since 3 weeks and she was seen by her PCP about 2 weeks back and given a 10 day course of oral doxycycline. During that time patient also had BRBPR for which her xarelto was held. Hemoglobin was stable. Patient did not have further symptoms.   Filed Weights   05/01/13 1242 05/01/13 1935 05/02/13 0611  Weight: 67.858 kg (149 lb 9.6 oz) 65.5 kg (144 lb 6.4 oz) 64 kg (141 lb 1.5 oz)        Intake/Output Summary (Last 24 hours) at 05/02/13 4098 Last data filed at 05/02/13 0800  Gross per 24 hour  Intake    540 ml  Output   1525 ml  Net   -985 ml     Assessment/Plan: Acute exacerbation of CHF (congestive heart failure) - Estimated dry weight 130 lb. - [place] UOP. Replete electrolytes as needed. - Cont lasix, metoprolol. - strict I and O's, daily weights.  Sepsis/ Cellulitis of leg without foot - On vanc 10.30.2014. - afebrile, WBC decreasing. - blood cultures 10.30.2014.  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   Dementia: - stable cont home meds.   Code Status: DNR as per daughter  Family Communication: daughter at bedside  Disposition Plan: inpatient     Consultants:  none  Procedures: ECHO: 6.5.2014: ejection fraction was in the range of 60% to 65%. Wall motion was normal; there were no regional wall motion abnormalities. Doppler parameters are consistent with abnormal left ventricular relaxation (grade 1 diastolic dysfunction).   Antibiotics:  Vanc 10.30.2014  HPI/Subjective: No complains  Objective: Filed Vitals:   05/01/13 1815 05/01/13 1845 05/01/13 1935 05/02/13 0611  BP: 104/74 130/70 114/63 98/68  Pulse: 62  90 92  Temp:    97.5 F (36.4 C) 97.6 F (36.4 C)  TempSrc:   Oral Oral  Resp: 19 26 21 18   Height:   5\' 5"  (1.651 m)   Weight:   65.5 kg (144 lb 6.4 oz) 64 kg (141 lb 1.5 oz)  SpO2: 97%  98% 95%   Filed Weights   05/01/13 1242 05/01/13 1935 05/02/13 0611  Weight: 67.858 kg (149 lb 9.6 oz) 65.5 kg (144 lb 6.4 oz) 64 kg (141 lb 1.5 oz)     Exam:  General: Alert, awake, oriented x3, in no acute distress.  HEENT: No bruits, no goiter. + JVD Heart: Regular rate and rhythm, + 3 edema. Lungs: Good air movement, bilateral air movement.  Abdomen: Soft, nontender, nondistended, positive bowel sounds.  Neuro: Grossly intact, nonfocal.   Data Reviewed: Basic Metabolic Panel:  Recent Labs Lab 05/01/13 1237 05/02/13 0415  NA 140 143  K 4.1 3.5  CL 104 102  CO2 21 26  GLUCOSE 98 93  BUN 26* 24*  CREATININE 0.89 1.06  CALCIUM 9.1 9.0   Liver Function Tests:  Recent Labs Lab 05/01/13 1237  AST 47*  ALT 41*  ALKPHOS 116  BILITOT 0.8  PROT 6.4  ALBUMIN 3.1*   No results found for this basename: LIPASE, AMYLASE,  in the last 168 hours No results found for this basename: AMMONIA,  in the last 168 hours CBC:  Recent Labs Lab 05/01/13 1237 05/02/13 0415  WBC 21.5* 18.0*  NEUTROABS  --  15.4*  HGB 11.5* 10.3*  HCT 37.0 33.1*  MCV 73.6* 72.9*  PLT 441* 401*   Cardiac Enzymes: No results found for this basename: CKTOTAL, CKMB, CKMBINDEX, TROPONINI,  in the last 168 hours BNP (last 3 results)  Recent Labs  05/01/13 1237  PROBNP 12469.0*   CBG: No results found for this basename: GLUCAP,  in the last 168 hours  No results found for this or any previous visit (from the past 240 hour(s)).   Studies: Dg Chest 2 View  05/01/2013   CLINICAL DATA:  Shortness of breath. History of pulmonary embolism. Swollen legs. Deep venous thrombosis.  EXAM: CHEST  2 VIEW  COMPARISON:  CHEST x-ray 12/04/2012. Chest CT 12/04/2012.  FINDINGS: Bibasilar opacities may reflect areas of atelectasis  and/or consolidation. Moderate right and small left effusion. Peripheral ill-defined opacity in the left mid lung. Cephalization of the pulmonary vasculature, without frank pulmonary edema. Mild cardiomegaly. Upper mediastinal contours are unremarkable. Atherosclerosis in the thoracic aorta. Old compression fracture of a mid thoracic vertebral body (likely T8), unchanged.  IMPRESSION: 1. Bibasilar areas of atelectasis and/or consolidation. In the appropriate clinical setting, this could be related to sequela of aspiration, developing infection, or multifocal pulmonary infarctions. Clinical correlation is recommended. Additionally, there is some ill-defined airspace disease in the left mid lung. 2. Moderate right and small left pleural effusions. 3. Cardiomegaly with pulmonary venous congestion, but no frank pulmonary edema. 4. Atherosclerosis.   Electronically Signed   By: Trudie Reed M.D.   On: 05/01/2013 13:35   Ct Angio Chest Pe W/cm &/or Wo Cm  05/01/2013   CLINICAL DATA:  Lower edema swelling ; shortness of breath ; history of previous pulmonary emboli  EXAM: CT ANGIOGRAPHY CHEST WITH CONTRAST  TECHNIQUE: Multidetector CT imaging of the chest was performed using the standard protocol during bolus administration of intravenous contrast. Multiplanar CT image reconstructions including MIPs were obtained to evaluate the vascular anatomy.  CONTRAST:  OMNIPAQUE IOHEXOL 350 MG/ML SOLN  COMPARISON:  Chest radiograph May 01, 2013 and chest CT angiogram December 04, 2012  FINDINGS: There are no but demonstrable pulmonary emboli. There is no thoracic aortic aneurysm. There is atherosclerotic change throughout the aorta.  There are moderate pleural effusions bilaterally, larger on the right than on the left. There is cardiomegaly. Pericardium is not thickened.  There are areas of patchy airspace opacity in both lower lobes as well as in the inferior lingula and right middle lobe medially. There is mild  interstitial edema in the bases is well.  There is no appreciable thoracic adenopathy.  The the limited visualization the upper abdomen shows no abnormality.  There are no blastic or lytic bone lesions. There is anterior wedging of the T10 vertebral body, stable from prior study. Thyroid appears unremarkable. The  Review of the MIP images confirms the above findings.  IMPRESSION: Congestive heart failure. Areas of superimposed pneumonia in the lingula and right upper lobe cannot be excluded. No demonstrable pulmonary embolus.   Electronically Signed   By: Bretta Bang M.D.   On: 05/01/2013 16:54    Scheduled Meds: . antiseptic oral rinse  15 mL Mouth Rinse q12n4p  . aspirin EC  81 mg Oral Daily  . calcium-vitamin D  1 tablet Oral BID  . chlorhexidine  15 mL Mouth Rinse BID  . cholecalciferol  1,000 Units Oral Daily  . divalproex  125 mg Oral Daily  . donepezil  10 mg  Oral QHS  . ferrous sulfate  325 mg Oral Q breakfast  . furosemide  40 mg Intravenous Q12H  . influenza vac split quadrivalent PF  0.5 mL Intramuscular Tomorrow-1000  . memantine  10 mg Oral BID  . metoprolol tartrate  12.5 mg Oral BID  . multivitamin with minerals  1 tablet Oral Daily  . piperacillin-tazobactam (ZOSYN)  IV  3.375 g Intravenous Q8H  . sodium chloride  3 mL Intravenous Q12H  . vancomycin  1,000 mg Intravenous Q24H   Continuous Infusions:    Marinda Elk  [facility] Pager [phone]. If 8PM-8AM, please contact night-coverage at [email or web address], password [facility] 05/02/2013, 8:38 AM  LOS: 1 day

## 2013-05-02 NOTE — Progress Notes (Signed)
Pt O to self on complaints of pain or SOB. Daughter at beside. Daughter states family is moving to [employer] [place] around nov 21st

## 2013-05-02 NOTE — Consult Note (Addendum)
WOC wound consult note Reason for Consult:Consult requested for cellulitis to legs.  Generalized edema to both legs, right leg with increased amt swelling and erythremia. Daughter at bedside thinks pt must have bumped it against something prior to admission. Pt on IV antibiotics which are more effective than topical care for treatment of cellulitis.   Wound type: Right inner calf with 5X6cm dark purple fluid filled blister.  Ruptures easily when touched and drained large amt old clotted blood from small opening.  Remains with large amt dark purple bruising underneath skin level; topical treatment will not be effective in resolving this since there is no open wound at this time.  Outer calf 2X2cm dark purple fluid filled blister with pinhole opening and small amt bloody drainage.  Right ankle with 2 areas of partial thickness skin loss; appears to be from previous blisters which have ruptured.  Each site .1X.1X.1cm, both with yellow wound bed and small amt yellow drainage. No odor to any sites. Dressing procedure/placement/frequency: Foam dressing to protect and promote healing to areas. Discussed plan of care with daughter at bedside; when to report to physician after discharge if wound begins to have slough, eschar, or odor and drainage. She appears to understand and denies further questions. Please re-consult if further assistance is needed.  Thank-you,  Cammie Mcgee MSN, RN, CWOCN, [place], CNS [phone]

## 2013-05-02 NOTE — ED Provider Notes (Signed)
I have personally seen and examined the patient.  I have discussed the plan of care with the resident.  I have reviewed the documentation on PMH/FH/Soc. History.  I have reviewed the documentation of the resident and agree.  Doug Sou, MD 05/02/13 [phone]

## 2013-05-02 NOTE — Progress Notes (Signed)
Physical Therapy Evaluation Patient Details Name: Caroline Schroeder MRN: [id number] DOB: 08-29-1920 Today's Date: 05/02/2013 Time: 4098-1191 PT Time Calculation (min): 25 min  PT Assessment / Plan / Recommendation History of Present Illness  Pt admit with CHF.    Clinical Impression  Pt admitted with above. Pt currently with functional limitations due to the deficits listed below (see PT Problem List). Will need HHPT f/u and has 24 hour assist as well as all equipment.   Pt will benefit from skilled PT to increase their independence and safety with mobility to allow discharge to the venue listed below.     PT Assessment  Patient needs continued PT services    Follow Up Recommendations  Home health PT;Supervision/Assistance - 24 hour                Equipment Recommendations  None recommended by PT         Frequency Min 3X/week    Precautions / Restrictions Precautions Precautions: Fall Restrictions Weight Bearing Restrictions: No   Pertinent Vitals/Pain O2 >90% throughout ambulation on RA.  O2 on RA at rest 95% except when pt talking desat to 90%.  Nursing placed O2 back on pt.  HR stable,  No pain      Mobility  Bed Mobility Bed Mobility: Not assessed Transfers Transfers: Sit to Stand;Stand to Sit Sit to Stand: 3: Mod assist;With upper extremity assist;From chair/3-in-1;With armrests Stand to Sit: 3: Mod assist;With upper extremity assist;To chair/3-in-1;With armrests Details for Transfer Assistance: Needed assist and cues for sit to stand and stand to sit.  Flexed posture with incr time to achieve upright posture.  Needed incr steadying assist initially upon standing as well.   Ambulation/Gait Ambulation/Gait Assistance: 4: Min assist;3: Mod assist Ambulation Distance (Feet): 45 Feet Assistive device: Rolling walker Ambulation/Gait Assistance Details: Pt ambulated with mod asssit to steer RW as well as for sequencing steps.  Cues needed for safety with RW.  Initiatlly  pt shuffling feet however she did take longer steps the longer she was ambulating.  As pt fatigued, postural stability worsened needing greater assist as well.   Gait Pattern: Step-to pattern;Decreased stride length;Shuffle;Festinating;Wide base of support;Trunk flexed Gait velocity: decreased Stairs: No Wheelchair Mobility Wheelchair Mobility: No    Exercises General Exercises - Lower Extremity Long Arc Quad: AROM;Both;10 reps;Seated Hip Flexion/Marching: AROM;Both;10 reps;Seated   PT Diagnosis: Generalized weakness  PT Problem List: Decreased activity tolerance;Decreased balance;Decreased mobility;Decreased knowledge of use of DME;Decreased safety awareness;Decreased knowledge of precautions PT Treatment Interventions: DME instruction;Gait training;Functional mobility training;Therapeutic activities;Therapeutic exercise;Balance training;Patient/family education     PT Goals(Current goals can be found in the care plan section) Acute Rehab PT Goals Patient Stated Goal: to gohome PT Goal Formulation: With patient Time For Goal Achievement: 05/09/13 Potential to Achieve Goals: Good  Visit Information  Last PT Received On: 05/02/13 Assistance Needed: +1 History of Present Illness: Pt admit with CHF.         Prior Functioning  Home Living Family/patient expects to be discharged to:: Private residence Living Arrangements: Children;Other relatives Available Help at Discharge: Family;Available 24 hours/day Type of Home: House Home Access: Level entry Home Layout: Two level;Bed/bath upstairs Alternate Level Stairs-Number of Steps: flight  Alternate Level Stairs-Rails: Right Home Equipment: Walker - 2 wheels;Shower seat;Bedside commode Additional Comments: dtr is supportive and can assist as needed; pt weaker than her baseline  Prior Function Level of Independence: Needs assistance Gait / Transfers Assistance Needed: Ambulates without device most of the time per daughter.  ADL's /  Homemaking Assistance Needed: sponge bathes per daughter Communication Communication: No difficulties    Cognition  Cognition Arousal/Alertness: Awake/alert Behavior During Therapy: WFL for tasks assessed/performed Overall Cognitive Status: Within Functional Limits for tasks assessed    Extremity/Trunk Assessment Upper Extremity Assessment Upper Extremity Assessment: Defer to OT evaluation Lower Extremity Assessment Lower Extremity Assessment: Generalized weakness Cervical / Trunk Assessment Cervical / Trunk Assessment: Kyphotic   Balance Balance Balance Assessed: Yes Static Standing Balance Static Standing - Balance Support: Bilateral upper extremity supported;During functional activity Static Standing - Level of Assistance: 4: Min assist Static Standing - Comment/# of Minutes: 3 minutes while being cleaned after gettting off 3N1, flexed posture and pt needing steadying assist.   End of Session PT - End of Session Equipment Utilized During Treatment: Gait belt;Oxygen Activity Tolerance: Patient limited by fatigue Patient left: in chair;with call bell/phone within reach;with family/visitor present Nurse Communication: Mobility status       INGOLD,Shamona Wirtz 05/02/2013, 3:47 PM [facility] Acute Rehabilitation [phone] (pager)

## 2013-05-03 DIAGNOSIS — I10 Essential (primary) hypertension: Secondary | ICD-10-CM

## 2013-05-03 DIAGNOSIS — I5031 Acute diastolic (congestive) heart failure: Secondary | ICD-10-CM

## 2013-05-03 LAB — BASIC METABOLIC PANEL
CO2: 26 mEq/L (ref 19–32)
Calcium: 9 mg/dL (ref 8.4–10.5)
Chloride: 100 mEq/L (ref 96–112)
Creatinine, Ser: 1.35 mg/dL — ABNORMAL HIGH (ref 0.50–1.10)
GFR calc non Af Amer: 33 mL/min — ABNORMAL LOW (ref >90)
Glucose, Bld: 96 mg/dL (ref 70–99)

## 2013-05-03 LAB — SODIUM, URINE, RANDOM: Sodium, Ur: 58 mEq/L

## 2013-05-03 MED ORDER — FUROSEMIDE 20 MG PO TABS
20.0000 mg | ORAL_TABLET | Freq: Every day | ORAL | Status: DC
  Filled 2013-05-03: qty 1

## 2013-05-03 MED ORDER — FUROSEMIDE 10 MG/ML IJ SOLN: 40.0000 mg | Freq: Two times a day (BID) | INTRAMUSCULAR | Status: DC

## 2013-05-03 MED ORDER — VANCOMYCIN HCL IN DEXTROSE 750-5 MG/150ML-% IV SOLN
750.0000 mg | INTRAVENOUS | Status: DC
  Administered 2013-05-03 – 2013-05-07 (×4): 750 mg via INTRAVENOUS
  Filled 2013-05-03 (×5): qty 150

## 2013-05-03 MED ORDER — FUROSEMIDE 10 MG/ML IJ SOLN
2.0000 mg/h | INTRAVENOUS | Status: DC
  Administered 2013-05-03: 13:00:00 4 mg/h via INTRAVENOUS
  Administered 2013-05-04: 8 mg/h via INTRAVENOUS
  Administered 2013-05-05: 10:00:00 2 mg/h via INTRAVENOUS
  Filled 2013-05-03 (×3): qty 25

## 2013-05-03 MED ORDER — POTASSIUM CHLORIDE CRYS ER 20 MEQ PO TBCR
20.0000 meq | EXTENDED_RELEASE_TABLET | Freq: Two times a day (BID) | ORAL | Status: AC
  Administered 2013-05-03 (×2): 20 meq via ORAL
  Filled 2013-05-03 (×2): qty 1

## 2013-05-03 MED ORDER — FUROSEMIDE 20 MG PO TABS: 20.0000 mg | ORAL_TABLET | Freq: Every day | ORAL | Status: DC

## 2013-05-03 NOTE — Progress Notes (Addendum)
[facility] PROGRESS NOTE Interim History: 79 year old female with advanced dementia who was on xarelto for B/l PE diagnosed in June of this year was brought in by her daughter for dyspnea on exertion for 7-10 days. Patient was also noted to have increased swelling in her legs R>L Since 3 weeks and she was seen by her PCP about 2 weeks back and given a 10 day course of oral doxycycline. During that time patient also had BRBPR for which her xarelto was held. Hemoglobin was stable. Patient did not have further symptoms.   Filed Weights   05/01/13 1935 05/02/13 0611 05/03/13 0500  Weight: 65.5 kg (144 lb 6.4 oz) 64 kg (141 lb 1.5 oz) 65 kg (143 lb 4.8 oz)        Intake/Output Summary (Last 24 hours) at 05/03/13 1012 Last data filed at 05/03/13 0836  Gross per 24 hour  Intake   1280 ml  Output    475 ml  Net    805 ml     Assessment/Plan: Acute exacerbation of diastolic CHF (congestive heart failure) - Estimated dry weight 130 lb. Poor urine output. - Replete electrolytes as needed. b-met in am. - Change lasix drip, metoprolol. Still massively overloaded, with > 10 cm of JVD. - Strict I and O's, daily weights.  Sepsis/ Cellulitis of leg without foot - On vanc 10.30.2014. - afebrile, WBC decreasing. - blood cultures 10.30.2014.  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   Dementia: - stable cont home meds.   Code Status: DNR as per daughter  Family Communication: daughter at bedside  Disposition Plan: inpatient     Consultants:  none  Procedures: ECHO: 6.5.2014: ejection fraction was in the range of 60% to 65%. Wall motion was normal; there were no regional wall motion abnormalities. Doppler parameters are consistent with abnormal left ventricular relaxation (grade 1 diastolic dysfunction).   Antibiotics:  Vanc 10.30.2014  HPI/Subjective: No complains.  Objective: Filed Vitals:   05/02/13 1841 05/02/13 2025 05/03/13  0500 05/03/13 0947  BP:  92/62 101/72 90/65  Pulse:  73 116 106  Temp:  97.6 F (36.4 C) 98 F (36.7 C)   TempSrc:  Oral Axillary   Resp: 20 18 18 18   Height:      Weight:   65 kg (143 lb 4.8 oz)   SpO2: 96% 92% 98% 93%   Filed Weights   05/01/13 1935 05/02/13 0611 05/03/13 0500  Weight: 65.5 kg (144 lb 6.4 oz) 64 kg (141 lb 1.5 oz) 65 kg (143 lb 4.8 oz)     Exam:  General: Alert, awake, oriented x3, in no acute distress.  HEENT: No bruits, no goiter. + JVD Heart: Regular rate and rhythm, + 3 edema. Lungs: Good air movement, bilateral air movement.  Abdomen: Soft, nontender, nondistended, positive bowel sounds.  Neuro: Grossly intact, nonfocal.   Data Reviewed: Basic Metabolic Panel:  Recent Labs Lab 05/01/13 1237 05/02/13 0415 05/03/13 0510  NA 140 143 140  K 4.1 3.5 3.4*  CL 104 102 100  CO2 21 26 26   GLUCOSE 98 93 96  BUN 26* 24* 28*  CREATININE 0.89 1.06 1.35*  CALCIUM 9.1 9.0 9.0   Liver Function Tests:  Recent Labs Lab 05/01/13 1237  AST 47*  ALT 41*  ALKPHOS 116  BILITOT 0.8  PROT 6.4  ALBUMIN 3.1*   No results found for this basename: LIPASE, AMYLASE,  in the last 168 hours No results found for  this basename: AMMONIA,  in the last 168 hours CBC:  Recent Labs Lab 05/01/13 1237 05/02/13 0415  WBC 21.5* 18.0*  NEUTROABS  --  15.4*  HGB 11.5* 10.3*  HCT 37.0 33.1*  MCV 73.6* 72.9*  PLT 441* 401*   Cardiac Enzymes: No results found for this basename: CKTOTAL, CKMB, CKMBINDEX, TROPONINI,  in the last 168 hours BNP (last 3 results)  Recent Labs  05/01/13 1237  PROBNP 12469.0*   CBG: No results found for this basename: GLUCAP,  in the last 168 hours  No results found for this or any previous visit (from the past 240 hour(s)).   Studies: Dg Chest 2 View  05/01/2013   CLINICAL DATA:  Shortness of breath. History of pulmonary embolism. Swollen legs. Deep venous thrombosis.  EXAM: CHEST  2 VIEW  COMPARISON:  CHEST x-ray 12/04/2012.  Chest CT 12/04/2012.  FINDINGS: Bibasilar opacities may reflect areas of atelectasis and/or consolidation. Moderate right and small left effusion. Peripheral ill-defined opacity in the left mid lung. Cephalization of the pulmonary vasculature, without frank pulmonary edema. Mild cardiomegaly. Upper mediastinal contours are unremarkable. Atherosclerosis in the thoracic aorta. Old compression fracture of a mid thoracic vertebral body (likely T8), unchanged.  IMPRESSION: 1. Bibasilar areas of atelectasis and/or consolidation. In the appropriate clinical setting, this could be related to sequela of aspiration, developing infection, or multifocal pulmonary infarctions. Clinical correlation is recommended. Additionally, there is some ill-defined airspace disease in the left mid lung. 2. Moderate right and small left pleural effusions. 3. Cardiomegaly with pulmonary venous congestion, but no frank pulmonary edema. 4. Atherosclerosis.   Electronically Signed   By: Trudie Reed M.D.   On: 05/01/2013 13:35   Ct Angio Chest Pe W/cm &/or Wo Cm  05/01/2013   CLINICAL DATA:  Lower edema swelling ; shortness of breath ; history of previous pulmonary emboli  EXAM: CT ANGIOGRAPHY CHEST WITH CONTRAST  TECHNIQUE: Multidetector CT imaging of the chest was performed using the standard protocol during bolus administration of intravenous contrast. Multiplanar CT image reconstructions including MIPs were obtained to evaluate the vascular anatomy.  CONTRAST:  OMNIPAQUE IOHEXOL 350 MG/ML SOLN  COMPARISON:  Chest radiograph May 01, 2013 and chest CT angiogram December 04, 2012  FINDINGS: There are no but demonstrable pulmonary emboli. There is no thoracic aortic aneurysm. There is atherosclerotic change throughout the aorta.  There are moderate pleural effusions bilaterally, larger on the right than on the left. There is cardiomegaly. Pericardium is not thickened.  There are areas of patchy airspace opacity in both lower lobes as  well as in the inferior lingula and right middle lobe medially. There is mild interstitial edema in the bases is well.  There is no appreciable thoracic adenopathy.  The the limited visualization the upper abdomen shows no abnormality.  There are no blastic or lytic bone lesions. There is anterior wedging of the T10 vertebral body, stable from prior study. Thyroid appears unremarkable. The  Review of the MIP images confirms the above findings.  IMPRESSION: Congestive heart failure. Areas of superimposed pneumonia in the lingula and right upper lobe cannot be excluded. No demonstrable pulmonary embolus.   Electronically Signed   By: Bretta Bang M.D.   On: 05/01/2013 16:54    Scheduled Meds: . antiseptic oral rinse  15 mL Mouth Rinse q12n4p  . aspirin EC  81 mg Oral Daily  . calcium-vitamin D  1 tablet Oral BID  . chlorhexidine  15 mL Mouth Rinse BID  . cholecalciferol  1,000 Units Oral Daily  . divalproex  125 mg Oral Daily  . donepezil  10 mg Oral QHS  . ferrous sulfate  325 mg Oral Q breakfast  . furosemide  20 mg Oral Daily  . memantine  10 mg Oral BID  . metoprolol tartrate  12.5 mg Oral BID  . multivitamin with minerals  1 tablet Oral Daily  . potassium chloride  20 mEq Oral BID  . sodium chloride  3 mL Intravenous Q12H  . vancomycin  1,000 mg Intravenous Q24H   Continuous Infusions:    Marinda Elk  [facility] Pager [phone]. If 8PM-8AM, please contact night-coverage at [email or web address], password [facility] 05/03/2013, 10:12 AM  LOS: 2 days

## 2013-05-03 NOTE — Progress Notes (Signed)
ANTIBIOTIC CONSULT NOTE - FOLLOW UP  Pharmacy Consult for Vancomycin Indication: pneumonia, LE cellulitis  No Known Allergies  Patient Measurements: Height: 5\' 5"  (165.1 cm) Weight: 143 lb 4.8 oz (65 kg) IBW/kg (Calculated) : 57 Adjusted Body Weight:   Vital Signs: Temp: 98 F (36.7 C) (11/01 0500) Temp src: Axillary (11/01 0500) BP: 90/65 mmHg (11/01 0947) Pulse Rate: 106 (11/01 0947) Intake/Output from previous day: 10/31 0701 - 11/01 0700 In: 1040 [P.O.:840; IV Piggyback:200] Out: 675 [Urine:675] Intake/Output from this shift: Total I/O In: 240 [P.O.:240] Out: 10 [Urine:10]  Labs:  Recent Labs  05/01/13 1237 05/02/13 0415 05/03/13 0510 05/03/13 1040  WBC 21.5* 18.0*  --   --   HGB 11.5* 10.3*  --   --   PLT 441* 401*  --   --   LABCREA  --   --   --  106.94  CREATININE 0.89 1.06 1.35*  --    Estimated Creatinine Clearance: 24.9 ml/min (by C-G formula based on Cr of 1.35). No results found for this basename: VANCOTROUGH, VANCOPEAK, VANCORANDOM, GENTTROUGH, GENTPEAK, GENTRANDOM, TOBRATROUGH, TOBRAPEAK, TOBRARND, AMIKACINPEAK, AMIKACINTROU, AMIKACIN,  in the last 72 hours   Microbiology: No results found for this or any previous visit (from the past 720 hour(s)).  Anti-infectives   Start     Dose/Rate Route Frequency Ordered Stop   05/01/13 2200  vancomycin (VANCOCIN) IVPB 1000 mg/200 mL premix     1,000 mg 200 mL/hr over 60 Minutes Intravenous Every 24 hours 05/01/13 2005     05/01/13 2100  piperacillin-tazobactam (ZOSYN) IVPB 3.375 g  Status:  Discontinued     3.375 g 12.5 mL/hr over 240 Minutes Intravenous Every 8 hours 05/01/13 2005 05/02/13 0852   05/01/13 1445  clindamycin (CLEOCIN) IVPB 600 mg     600 mg 100 mL/hr over 30 Minutes Intravenous  Once 05/01/13 1436 05/01/13 1615      Assessment: 79yo female with pneumonia, on Vancomycin and Zosyn.  Cr up to 1.35 today.  No culture data.  Goal of Therapy:  Vancomycin trough level 15-20  mcg/ml  Plan:  1.  Change Vancomycin to 750mg  IV q24 2.  No change needed for Zosyn dose at this time.  Marisue Humble, PharmD Clinical Pharmacist [facility]

## 2013-05-03 NOTE — Progress Notes (Signed)
Foley place per order, sterile tech used. Urine returned

## 2013-05-03 NOTE — Progress Notes (Signed)
Pt in bed, O to self on complaints onof pain or SOB, currently on RA. Will continue to monitor

## 2013-05-03 NOTE — Progress Notes (Signed)
Pt SBP in 90s, Pt up 1 kg, edema appears unchanged + 3/ +4. Pt chews meds would avoid meds that can not be chewed if possible.

## 2013-05-04 LAB — CBC
MCHC: 30.9 g/dL (ref 30.0–36.0)
Platelets: 442 10*3/uL — ABNORMAL HIGH (ref 150–400)
RDW: 22.9 % — ABNORMAL HIGH (ref 11.5–15.5)

## 2013-05-04 LAB — BASIC METABOLIC PANEL
BUN: 27 mg/dL — ABNORMAL HIGH (ref 6–23)
Calcium: 9.4 mg/dL (ref 8.4–10.5)
Creatinine, Ser: 1.32 mg/dL — ABNORMAL HIGH (ref 0.50–1.10)
GFR calc Af Amer: 40 mL/min — ABNORMAL LOW (ref >90)
GFR calc non Af Amer: 34 mL/min — ABNORMAL LOW (ref >90)

## 2013-05-04 MED ORDER — PIPERACILLIN-TAZOBACTAM 3.375 G IVPB
3.3750 g | Freq: Three times a day (TID) | INTRAVENOUS | Status: DC
  Administered 2013-05-04 – 2013-05-07 (×10): 3.375 g via INTRAVENOUS
  Filled 2013-05-04 (×12): qty 50

## 2013-05-04 MED ORDER — METOLAZONE 2.5 MG PO TABS
2.5000 mg | ORAL_TABLET | Freq: Every day | ORAL | Status: DC
  Administered 2013-05-04: 11:00:00 2.5 mg via ORAL
  Filled 2013-05-04 (×2): qty 1

## 2013-05-04 MED ORDER — POTASSIUM CHLORIDE CRYS ER 20 MEQ PO TBCR
40.0000 meq | EXTENDED_RELEASE_TABLET | Freq: Two times a day (BID) | ORAL | Status: AC
  Administered 2013-05-04 (×2): 40 meq via ORAL
  Filled 2013-05-04 (×2): qty 2

## 2013-05-04 NOTE — Progress Notes (Signed)
ANTIBIOTIC CONSULT NOTE - INITIAL  Pharmacy Consult for Zosyn Indication: broadening coverage for cellulitis  No Known Allergies  Patient Measurements: Height: 5\' 5"  (165.1 cm) Weight: 143 lb 4.8 oz (65 kg) IBW/kg (Calculated) : 57 Adjusted Body Weight:   Vital Signs: Temp: 98 F (36.7 C) (11/02 1030) Temp src: Oral (11/02 1030) BP: 101/67 mmHg (11/02 0650) Pulse Rate: 117 (11/02 1030) Intake/Output from previous day: 11/01 0701 - 11/02 0700 In: 479.6 [P.O.:290; I.V.:39.6; IV Piggyback:150] Out: 2410 [Urine:2410] Intake/Output from this shift: Total I/O In: 220 [P.O.:220] Out: 750 [Urine:750]  Labs:  Recent Labs  05/01/13 1237 05/02/13 0415 05/03/13 0510 05/03/13 1040 05/04/13 0615  WBC 21.5* 18.0*  --   --  24.5*  HGB 11.5* 10.3*  --   --  11.2*  PLT 441* 401*  --   --  442*  LABCREA  --   --   --  106.94  --   CREATININE 0.89 1.06 1.35*  --  1.32*   Estimated Creatinine Clearance: 25.5 ml/min (by C-G formula based on Cr of 1.32). No results found for this basename: VANCOTROUGH, VANCOPEAK, VANCORANDOM, GENTTROUGH, GENTPEAK, GENTRANDOM, TOBRATROUGH, TOBRAPEAK, TOBRARND, AMIKACINPEAK, AMIKACINTROU, AMIKACIN,  in the last 72 hours   Microbiology: No results found for this or any previous visit (from the past 720 hour(s)).  Medical History: Past Medical History  Diagnosis Date  . Hypertension   . High cholesterol   . Essential thrombocythemia   . Onychia and paronychia of toe   . Other specified disease of white blood cells   . Abnormality of gait   . Hyperpotassemia   . Urinary frequency   . Alveolitis of jaw   . Osteoporosis, unspecified   . Hemorrhage of gastrointestinal tract, unspecified   . Osteoarthrosis, unspecified whether generalized or localized, unspecified site   . Memory loss   . Unspecified urinary incontinence   . CHF (congestive heart failure)   . Pulmonary embolism 12/2012    bilateral/notes 05/01/2013  . DVT (deep venous thrombosis)  12/2012    "left leg" (05/01/2013)  . Exertional shortness of breath     "just now" (05/01/2013)  . Dementia     "has had problems for last 10 yrs or so" (05/01/2013)  . Alzheimer's disease   . Dementia in conditions classified elsewhere without behavioral disturbance(294.10)   . Urinary incontinence     Medications:  Scheduled:  . antiseptic oral rinse  15 mL Mouth Rinse q12n4p  . aspirin EC  81 mg Oral Daily  . calcium-vitamin D  1 tablet Oral BID  . chlorhexidine  15 mL Mouth Rinse BID  . cholecalciferol  1,000 Units Oral Daily  . divalproex  125 mg Oral Daily  . donepezil  10 mg Oral QHS  . ferrous sulfate  325 mg Oral Q breakfast  . memantine  10 mg Oral BID  . metolazone  2.5 mg Oral Daily  . metoprolol tartrate  12.5 mg Oral BID  . multivitamin with minerals  1 tablet Oral Daily  . potassium chloride  40 mEq Oral BID  . sodium chloride  3 mL Intravenous Q12H  . vancomycin  750 mg Intravenous Q24H   Assessment: 79yo female on Vancomycin for cellulitis with increasing WBC to 24.5 this AM.  No cx data.  To add Zosyn  Goal of Therapy:  resolution of infection  Plan:  1.  Zosyn 3.375gm IV q8, infusing over 4 hours  Marisue Humble, PharmD Clinical Pharmacist [facility]

## 2013-05-04 NOTE — Progress Notes (Signed)
[facility] PROGRESS NOTE Interim History: 79 year old female with advanced dementia who was on xarelto for B/l PE diagnosed in June of this year was brought in by her daughter for dyspnea on exertion for 7-10 days. Patient was also noted to have increased swelling in her legs R>L Since 3 weeks and she was seen by her PCP about 2 weeks back and given a 10 day course of oral doxycycline. During that time patient also had BRBPR for which her xarelto was held. Hemoglobin was stable. Patient did not have further symptoms.   Filed Weights   05/01/13 1935 05/02/13 0611 05/03/13 0500  Weight: 65.5 kg (144 lb 6.4 oz) 64 kg (141 lb 1.5 oz) 65 kg (143 lb 4.8 oz)        Intake/Output Summary (Last 24 hours) at 05/04/13 0846 Last data filed at 05/04/13 0839  Gross per 24 hour  Intake  459.6 ml  Output   2810 ml  Net -2350.4 ml     Assessment/Plan: Acute exacerbation of diastolic CHF (congestive heart failure) - Estimated dry weight 130 lb. Poor urine output. - Replete electrolytes as needed. b-met in am. - Cont lasix drip, metoprolol. Add low dose metolazone.Still massively overloaded, with  JVD. - Strict I and O's, daily weights.  Sepsis/ Cellulitis of leg without foot - On vanc 10.30.2014. - afebrile, WBC decreasing. - blood cultures 10.30.2014. - negative for DVT.  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   Dementia: - stable cont home meds.   Code Status: DNR as per daughter  Family Communication: daughter at bedside  Disposition Plan: inpatient     Consultants:  none  Procedures: ECHO: 6.5.2014: ejection fraction was in the range of 60% to 65%. Wall motion was normal; there were no regional wall motion abnormalities. Doppler parameters are consistent with abnormal left ventricular relaxation (grade 1 diastolic dysfunction).   Antibiotics:  Vanc 10.30.2014  HPI/Subjective: No complains.  Objective: Filed Vitals:   05/03/13 1807 05/03/13 2100 05/04/13 0142 05/04/13 0650  BP: 102/59 102/63 117/71 101/67  Pulse:  108 101 102  Temp:  97.9 F (36.6 C)  97.6 F (36.4 C)  TempSrc:  Oral  Oral  Resp: 18 22  20   Height:      Weight:      SpO2: 94% 96%  92%   Filed Weights   05/01/13 1935 05/02/13 0611 05/03/13 0500  Weight: 65.5 kg (144 lb 6.4 oz) 64 kg (141 lb 1.5 oz) 65 kg (143 lb 4.8 oz)     Exam:  General: Alert, awake, oriented x3, in no acute distress.  HEENT: No bruits, no goiter. + JVD Heart: Regular rate and rhythm, + 3 edema. Lungs: Good air movement, bilateral air movement.  Abdomen: Soft, nontender, nondistended, positive bowel sounds.  Neuro: Grossly intact, nonfocal.   Data Reviewed: Basic Metabolic Panel:  Recent Labs Lab 05/01/13 1237 05/02/13 0415 05/03/13 0510 05/04/13 0615  NA 140 143 140 138  K 4.1 3.5 3.4* 3.6  CL 104 102 100 98  CO2 21 26 26 28   GLUCOSE 98 93 96 123*  BUN 26* 24* 28* 27*  CREATININE 0.89 1.06 1.35* 1.32*  CALCIUM 9.1 9.0 9.0 9.4   Liver Function Tests:  Recent Labs Lab 05/01/13 1237  AST 47*  ALT 41*  ALKPHOS 116  BILITOT 0.8  PROT 6.4  ALBUMIN 3.1*   No results found for this basename: LIPASE, AMYLASE,  in the last 168 hours No  results found for this basename: AMMONIA,  in the last 168 hours CBC:  Recent Labs Lab 05/01/13 1237 05/02/13 0415 05/04/13 0615  WBC 21.5* 18.0* 24.5*  NEUTROABS  --  15.4*  --   HGB 11.5* 10.3* 11.2*  HCT 37.0 33.1* 36.3  MCV 73.6* 72.9* 73.0*  PLT 441* 401* 442*   Cardiac Enzymes: No results found for this basename: CKTOTAL, CKMB, CKMBINDEX, TROPONINI,  in the last 168 hours BNP (last 3 results)  Recent Labs  05/01/13 1237  PROBNP 12469.0*   CBG: No results found for this basename: GLUCAP,  in the last 168 hours  No results found for this or any previous visit (from the past 240 hour(s)).   Studies: No results found.  Scheduled Meds: . antiseptic oral rinse  15 mL Mouth Rinse  q12n4p  . aspirin EC  81 mg Oral Daily  . calcium-vitamin D  1 tablet Oral BID  . chlorhexidine  15 mL Mouth Rinse BID  . cholecalciferol  1,000 Units Oral Daily  . divalproex  125 mg Oral Daily  . donepezil  10 mg Oral QHS  . ferrous sulfate  325 mg Oral Q breakfast  . memantine  10 mg Oral BID  . metolazone  2.5 mg Oral Daily  . metoprolol tartrate  12.5 mg Oral BID  . multivitamin with minerals  1 tablet Oral Daily  . potassium chloride  40 mEq Oral BID  . sodium chloride  3 mL Intravenous Q12H  . vancomycin  750 mg Intravenous Q24H   Continuous Infusions: . furosemide (LASIX) infusion 8 mg/hr (05/04/13 0818)     Marinda Elk  [facility] Pager [phone]. If 8PM-8AM, please contact night-coverage at [email or web address], password [facility] 05/04/2013, 8:46 AM  LOS: 3 days

## 2013-05-04 NOTE — Progress Notes (Signed)
Pt had a 19 ? Beat of V-tach, no S/S, BP 117/71, HR 101, has Bmet in AM, will continue to monitor, thanks Glenna Fellows

## 2013-05-05 DIAGNOSIS — L02419 Cutaneous abscess of limb, unspecified: Secondary | ICD-10-CM

## 2013-05-05 DIAGNOSIS — I350 Nonrheumatic aortic (valve) stenosis: Secondary | ICD-10-CM | POA: Diagnosis present

## 2013-05-05 DIAGNOSIS — Z515 Encounter for palliative care: Secondary | ICD-10-CM

## 2013-05-05 DIAGNOSIS — R0609 Other forms of dyspnea: Secondary | ICD-10-CM

## 2013-05-05 DIAGNOSIS — R531 Weakness: Secondary | ICD-10-CM

## 2013-05-05 DIAGNOSIS — R0600 Dyspnea, unspecified: Secondary | ICD-10-CM

## 2013-05-05 DIAGNOSIS — R5381 Other malaise: Secondary | ICD-10-CM

## 2013-05-05 DIAGNOSIS — I359 Nonrheumatic aortic valve disorder, unspecified: Secondary | ICD-10-CM

## 2013-05-05 LAB — BASIC METABOLIC PANEL
GFR calc Af Amer: 35 mL/min — ABNORMAL LOW (ref >90)
GFR calc non Af Amer: 30 mL/min — ABNORMAL LOW (ref >90)
Potassium: 4.3 mEq/L (ref 3.5–5.1)
Sodium: 140 mEq/L (ref 135–145)

## 2013-05-05 LAB — CBC
HCT: 35.8 % — ABNORMAL LOW (ref 36.0–46.0)
Hemoglobin: 11.1 g/dL — ABNORMAL LOW (ref 12.0–15.0)
MCHC: 31 g/dL (ref 30.0–36.0)
RBC: 4.84 MIL/uL (ref 3.87–5.11)
WBC: 22 10*3/uL — ABNORMAL HIGH (ref 4.0–10.5)

## 2013-05-05 NOTE — Consult Note (Signed)
Patient HQ:Caroline Schroeder      DOB: July 03, 1921      MWU:[id number]     Consult Note from the Palliative Medicine Team at [facility]    Consult Requested by: Dr Radonna Ricker     PCP: Bufford Spikes, DO Reason for Consultation:Clarification of GOC and options     Phone Number:[phone]  Assessment of patients Current state: 79 year old female with advanced dementia who was on xarelto for B/l PE diagnosed in June of this year was brought in by her daughter for dyspnea on exertion for 7-10 days. Patient was also noted to have increased swelling in her legs R>L Since 3 weeks and she was seen by her PCP about 2 weeks back and given a 10 day course of oral doxycycline. During that time patient also had BRBPR for which her xarelto was held. Hemoglobin was stable. Patient did not have further symptoms.  Daughter brought patient to hospital for for increased dyspnea and decreased exercise tolerance  over the last 7-10 days. In ER Doppler of LE was done which was negative for PE. The patient was noted to have significant leukocytosis with markedly elevated pro BNP of 12k. Chest x-ray showed bibasilar atelectasis versus consolidation. Also showed moderate bilateral pleural effusions with pulmonary vascular congestion. A CT angiogram of the chest was done with constant for PE which was negative..   Family reports continued decline related to her dementia of 10 years and now faced with anticipatory care needs and decisions regarding medical interventions to prolong life or to focus on comfort only.     This NP Lorinda Creed reviewed medical records, received report from team, assessed the patient and then meet at the patient's bedside along with her daughter and main decision maker Anell Barr # [phone]/ C# [phone]  to discuss diagnosis prognosis, GOC, EOL wishes disposition and options.   A detailed discussion was had today regarding advanced directives.  Concepts specific to code status, artifical  feeding and hydration, continued IV antibiotics and rehospitalization was had.  The difference between a aggressive medical intervention path  and a palliative comfort care path for this patient at this time was had.  Values and goals of care important to patient and family were attempted to be elicited.  Concept of Hospice and Palliative Care were discussed  Natural trajectory and expectations at EOL were discussed.  Questions and concerns addressed.  Hard Choices booklet left for review. Family encouraged to call with questions or concerns.  PMT will continue to support holistically.    Goals of Care: 1.  Code Status: DNR/DNI   2. Scope of Treatment: 1. Vital Signs: per unit   2. Respiratory/Oxygen: as needed for treatment 3. Nutritional Support/Tube Feeds: no artificial feeding now or in the future 4. Antibiotics: yes 5. Blood Products:yes 6. IVF:yes 7. Review of Medications to be discontinued:continue present medication 8. Labs: yes   4. Disposition:  Undecided, pending outcomes   3. Symptom Management:   1. Weakness: PT/OT evaluation and treatment to determine appropriateness of SNF for rehab 2. Dyspnea: medically management at this time, discussed use of opioids with daughter  4. Psychosocial:  Emotional support offered to daughter.  This is very DIFFICULT situation in that they are plannuing to move in three weeks to [facility].  This complicated discharge plan for this pateint.    Patient Documents Completed or Given: Document Given Completed  Advanced Directives Pkt    MOST yes   DNR    Gone from My Sight  Hard Choices yes     Brief HPI: 79 year old female with advanced dementia who was on xarelto for B/l PE diagnosed in June of this year was brought in by her daughter for dyspnea on exertion for 7-10 days. Patient was also noted to have increased swelling in her legs R>L Since 3 weeks and she was seen by her PCP about 2 weeks back and given a 10 day course of  oral doxycycline. During that time patient also had BRBPR for which her xarelto was held. Hemoglobin was stable. Patient did not have further symptoms.  Daughter brought patient to hospital for for increased dyspnea and decreased exercise tolerance  over the last 7-10 days. In ER Doppler of LE was done which was negative for PE. The patient was noted to have significant leukocytosis with markedly elevated pro BNP of 12k. Chest x-ray showed bibasilar atelectasis versus consolidation. Also showed moderate bilateral pleural effusions with pulmonary vascular congestion. A CT angiogram of the chest was done with constant for PE which was negative..   Family reports continued decline related to her dementia of 10 years and now faced with anticipatory care needs and decisions regarding medical interventions to prolong life or to focus on comfort only.       ROS: unable to illicit dew to decreased cognition    PMH:  Past Medical History  Diagnosis Date  . Hypertension   . High cholesterol   . Essential thrombocythemia   . Onychia and paronychia of toe   . Other specified disease of white blood cells   . Abnormality of gait   . Hyperpotassemia   . Urinary frequency   . Alveolitis of jaw   . Osteoporosis, unspecified   . Hemorrhage of gastrointestinal tract, unspecified   . Osteoarthrosis, unspecified whether generalized or localized, unspecified site   . Memory loss   . Unspecified urinary incontinence   . CHF (congestive heart failure)   . Pulmonary embolism 12/2012    bilateral/notes 05/01/2013  . DVT (deep venous thrombosis) 12/2012    "left leg" (05/01/2013)  . Exertional shortness of breath     "just now" (05/01/2013)  . Dementia     "has had problems for last 10 yrs or so" (05/01/2013)  . Alzheimer's disease   . Dementia in conditions classified elsewhere without behavioral disturbance(294.10)   . Urinary incontinence      PSH: Past Surgical History  Procedure Laterality  Date  . Appendectomy    . Cataract extraction w/ intraocular lens  implant, bilateral Bilateral ?2000's   I have reviewed the FH and SH and  If appropriate update it with new information. No Known Allergies Scheduled Meds: . antiseptic oral rinse  15 mL Mouth Rinse q12n4p  . aspirin EC  81 mg Oral Daily  . calcium-vitamin D  1 tablet Oral BID  . chlorhexidine  15 mL Mouth Rinse BID  . cholecalciferol  1,000 Units Oral Daily  . divalproex  125 mg Oral Daily  . donepezil  10 mg Oral QHS  . ferrous sulfate  325 mg Oral Q breakfast  . memantine  10 mg Oral BID  . metoprolol tartrate  12.5 mg Oral BID  . multivitamin with minerals  1 tablet Oral Daily  . piperacillin-tazobactam (ZOSYN)  IV  3.375 g Intravenous Q8H  . sodium chloride  3 mL Intravenous Q12H  . vancomycin  750 mg Intravenous Q24H   Continuous Infusions: . furosemide (LASIX) infusion 2 mg/hr (05/05/13 1016)   PRN Meds:.sodium  chloride, acetaminophen, ondansetron (ZOFRAN) IV, sodium chloride    BP 104/59  Pulse 100  Temp(Src) 98.7 F (37.1 C) (Oral)  Resp 20  Ht 5\' 5"  (1.651 m)  Wt 58.5 kg (128 lb 15.5 oz)  BMI 21.46 kg/m2  SpO2 97%   PPS:30 %   Intake/Output Summary (Last 24 hours) at 05/05/13 1540 Last data filed at 05/05/13 1500  Gross per 24 hour  Intake 1499.2 ml  Output   3825 ml  Net -2325.8 ml    Physical Exam:  General: chronically ill appearing, frail elderly female HEENT:  Mm, no exudate Chest:   Diminished in bases, tachypnic rate at 24  CVS: tachycardic rate 104 Abdomen: soft NT +BS Ext: BLE trace edema, TED hose In place noted dressing, dry and intact Neuro: oriented to person oonly  Labs: CBC    Component Value Date/Time   WBC 22.0* 05/05/2013 0510   WBC 22.6* 04/17/2013 1559   RBC 4.84 05/05/2013 0510   RBC 5.08 04/17/2013 1559   HGB 11.1* 05/05/2013 0510   HCT 35.8* 05/05/2013 0510   PLT 418* 05/05/2013 0510   MCV 74.0* 05/05/2013 0510   MCH 22.9* 05/05/2013 0510   MCH 22.2*  04/17/2013 1559   MCHC 31.0 05/05/2013 0510   MCHC 29.7* 04/17/2013 1559   RDW 22.9* 05/05/2013 0510   RDW 20.3* 04/17/2013 1559   LYMPHSABS 1.3 05/02/2013 0415   LYMPHSABS 1.3 04/17/2013 1559   MONOABS 1.1* 05/02/2013 0415   EOSABS 0.2 05/02/2013 0415   EOSABS 0.1 04/17/2013 1559   BASOSABS 0.0 05/02/2013 0415   BASOSABS 0.1 04/17/2013 1559    BMET    Component Value Date/Time   NA 140 05/05/2013 0510   NA 144 04/17/2013 1559   K 4.3 05/05/2013 0510   CL 97 05/05/2013 0510   CO2 34* 05/05/2013 0510   GLUCOSE 131* 05/05/2013 0510   GLUCOSE 126* 04/17/2013 1559   BUN 27* 05/05/2013 0510   BUN 39* 04/17/2013 1559   CREATININE 1.48* 05/05/2013 0510   CALCIUM 9.2 05/05/2013 0510   GFRNONAA 30* 05/05/2013 0510   GFRAA 35* 05/05/2013 0510    CMP     Component Value Date/Time   NA 140 05/05/2013 0510   NA 144 04/17/2013 1559   K 4.3 05/05/2013 0510   CL 97 05/05/2013 0510   CO2 34* 05/05/2013 0510   GLUCOSE 131* 05/05/2013 0510   GLUCOSE 126* 04/17/2013 1559   BUN 27* 05/05/2013 0510   BUN 39* 04/17/2013 1559   CREATININE 1.48* 05/05/2013 0510   CALCIUM 9.2 05/05/2013 0510   PROT 6.4 05/01/2013 1237   PROT 6.4 04/17/2013 1559   ALBUMIN 3.1* 05/01/2013 1237   AST 47* 05/01/2013 1237   ALT 41* 05/01/2013 1237   ALKPHOS 116 05/01/2013 1237   BILITOT 0.8 05/01/2013 1237   GFRNONAA 30* 05/05/2013 0510   GFRAA 35* 05/05/2013 0510       Time In Time Out Total Time Spent with Patient Total Overall Time  1400 1600 100 min 120 min    Greater than 50%  of this time was spent counseling and coordinating care related to the above assessment and plan.  Lorinda Creed NP  Palliative Medicine Team Team Phone # [phone] Pager [phone]  Discussed with DR Radonna Ricker

## 2013-05-05 NOTE — Progress Notes (Signed)
Physical Therapy Treatment Patient Details Name: Caroline Schroeder MRN: [id number] DOB: [birth year] Today's Date: 05/05/2013 Time: 5784-6962 PT Time Calculation (min): 43 min  PT Assessment / Plan / Recommendation  History of Present Illness Pt admit with CHF.     PT Comments   Pt with functional decline since initial eval. One week ago pt was ambulating without device with supervision and able to negotiate stairs to her bedroom. Pt now requires maxA for all transfers and has minimal ambulation tolerance with RW. Pt to strongly benefit from ST-SNF placement to achieve maximum functional recovery for safe transition back home with daughter. Spoke extensively with pt's daughter regarding d/c planning due to pt and daughter planning on moving in 3 weeks. Spoke with CSW regarding d/c recommendations.   Follow Up Recommendations  SNF;Supervision/Assistance - 24 hour     Does the patient have the potential to tolerate intense rehabilitation     Barriers to Discharge        Equipment Recommendations  None recommended by PT    Recommendations for Other Services    Frequency Min 3X/week   Progress towards PT Goals Progress towards PT goals: Not progressing toward goals - comment (pt regressed from initial eval)  Plan Discharge plan needs to be updated    Precautions / Restrictions Precautions Precautions: Fall Restrictions Weight Bearing Restrictions: No   Pertinent Vitals/Pain Denies pain    Mobility  Bed Mobility Bed Mobility: Supine to Sit;Sit to Supine Supine to Sit: 2: Max assist;HOB elevated Sit to Supine: 3: Mod assist;HOB flat Details for Bed Mobility Assistance: max verbal/tactile cues to complete task, maxA to bring hips to EOB and assist to elevate trunk Transfers Transfers: Sit to Stand;Stand to Sit Sit to Stand: 2: Max assist;With upper extremity assist;From bed Stand to Sit: 2: Max assist;With upper extremity assist;To chair/3-in-1 Details for Transfer Assistance:  Needed assist and cues for sit to stand and stand to sit.  Flexed posture with incr time to achieve upright posture.  Needed incr steadying assist initially upon standing as well.   Ambulation/Gait Ambulation/Gait Assistance: 2: Max assist Ambulation Distance (Feet): 5 Feet (x2) Assistive device: Rolling walker Ambulation/Gait Assistance Details: shuffled gait pattern, no foot clearance. Pt with quick onset of fatigue, progressive trunk flexion. by end of 5 feet max A required to reach chair safely Gait Pattern: Step-to pattern;Decreased stride length;Shuffle;Festinating;Wide base of support;Trunk flexed Gait velocity: decreased General Gait Details: pt at significant falls risk Stairs: No    Exercises     PT Diagnosis:    PT Problem List:   PT Treatment Interventions:     PT Goals (current goals can now be found in the care plan section)    Visit Information  Last PT Received On: 05/05/13 Assistance Needed: +1 History of Present Illness: Pt admit with CHF.      Subjective Data      Cognition  Cognition Arousal/Alertness: Lethargic Behavior During Therapy: Flat affect Overall Cognitive Status: History of cognitive impairments - at baseline    Balance  Static Sitting Balance Static Sitting - Balance Support: Bilateral upper extremity supported Static Sitting - Level of Assistance: 4: Min assist Static Sitting - Comment/# of Minutes: 5 min Static Standing Balance Static Standing - Balance Support: Bilateral upper extremity supported;During functional activity Static Standing - Level of Assistance: 3: Mod assist Static Standing - Comment/# of Minutes: 3 min for hygiene s/p stool incontinence  End of Session PT - End of Session Equipment Utilized During Treatment: Gait belt;Oxygen Activity  Tolerance: Patient limited by fatigue Patient left: in bed;with call bell/phone within reach;with family/visitor present (LEs elevated with HOB flat) Nurse Communication: Mobility status    GP     Marcene Brawn 05/05/2013, 4:37 PM   Lewis Shock, PT, DPT Pager #: [phone] Office #: [phone]

## 2013-05-05 NOTE — Progress Notes (Signed)
Thank you for consulting the Palliative Medicine Team at [facility] to meet your patient's and family's needs.   The reason that you asked [place] to see your patient is for goals of care discussion  We have scheduled your patient for a meeting: today, Monday 05/05/13 @ 2:00 pm  The Surrogate decision maker is: daughter Anell Barr c: [phone]  Other family members that need to be present: per daughter, she is decision-maker; her brother/patient's son lives out of state; daughter's husband may be able to be available but she is unsure  Your patient is unable to participate: patient has Dementia, on this visit patient feeding herself lunch, pleasant, alert orient to self only; could not state name of her daughter, or whether daughter had been in the room (daughter had just stepped out to go to cafeteria) unable to state where she was or time of day     Valente David, RN 05/05/2013, 1:17 PM Palliative Medicine Team RN Liaison [phone]

## 2013-05-05 NOTE — Progress Notes (Addendum)
[facility] PROGRESS NOTE Interim History: 79 year old female with advanced dementia who was on xarelto for B/l PE diagnosed in June of this year was brought in by her daughter for dyspnea on exertion for 7-10 days. Patient was also noted to have increased swelling in her legs R>L Since 3 weeks and she was seen by her PCP about 2 weeks back and given a 10 day course of oral doxycycline. During that time patient also had BRBPR for which her xarelto was held. Hemoglobin was stable. Patient did not have further symptoms.   Filed Weights   05/02/13 0611 05/03/13 0500 05/05/13 0630  Weight: 64 kg (141 lb 1.5 oz) 65 kg (143 lb 4.8 oz) 58.5 kg (128 lb 15.5 oz)        Intake/Output Summary (Last 24 hours) at 05/05/13 0848 Last data filed at 05/05/13 0700  Gross per 24 hour  Intake  821.6 ml  Output   4175 ml  Net -3353.4 ml     Assessment/Plan: Acute exacerbation of diastolic CHF (congestive heart failure) - Good urine output. - Replete electrolytes as needed. b-met in am. - Cont lasix drip at a lower dose, cont metoprolol. JVD and 2+ edema - Strict I and O's, daily weights.  Severe Ao. Stenosis: - Critical stenosis due to severely thickened and calcified leaflets with a valve area of 0.39 cm square on ECHO on 6.2014  - Given advanced age would not be a surgical candidate. - family will like to meet with Palliative care, consult PMT for goals of care.  Sepsis/ Cellulitis of leg without foot - On vanc 10.30.2014. - afebrile, WBC decreasing. - blood cultures 10.30.2014. - negative for DVT.  New onset a-fib - rate controlled, no on anticoagulation due to melanotic stools.   Dementia: - stable cont home meds.   Code Status: DNR as per daughter  Family Communication: daughter at bedside  Disposition Plan: inpatient     Consultants:  none  Procedures: ECHO: 6.5.2014: ejection fraction was in the range of 60% to 65%. Wall motion was normal; there were no regional wall  motion abnormalities. Doppler parameters are consistent with abnormal left ventricular relaxation (grade 1 diastolic dysfunction).   Antibiotics:  Vanc 10.30.2014  HPI/Subjective: No complains.  Objective: Filed Vitals:   05/04/13 1030 05/04/13 1424 05/04/13 2040 05/05/13 0630  BP:  104/62 109/71 102/61  Pulse: 117 114 89 102  Temp: 98 F (36.7 C) 97.9 F (36.6 C) 98.4 F (36.9 C) 98.1 F (36.7 C)  TempSrc: Oral Oral Oral Oral  Resp: 20 20 20 17   Height:      Weight:    58.5 kg (128 lb 15.5 oz)  SpO2: 98% 95% 99% 96%   Filed Weights   05/02/13 0611 05/03/13 0500 05/05/13 0630  Weight: 64 kg (141 lb 1.5 oz) 65 kg (143 lb 4.8 oz) 58.5 kg (128 lb 15.5 oz)     Exam:  General: Alert, awake, oriented x2, in no acute distress.  HEENT: + JVD Heart: Regular rate and rhythm, + 2 edema. Lungs: Good air movement, bilateral air movement.  Abdomen: Soft, nontender, nondistended, positive bowel sounds.  Neuro: Grossly intact, nonfocal.   Data Reviewed: Basic Metabolic Panel:  Recent Labs Lab 05/01/13 1237 05/02/13 0415 05/03/13 0510 05/04/13 0615 05/05/13 0510  NA 140 143 140 138 140  K 4.1 3.5 3.4* 3.6 4.3  CL 104 102 100 98 97  CO2 21 26 26 28  34*  GLUCOSE 98 93 96 123* 131*  BUN  26* 24* 28* 27* 27*  CREATININE 0.89 1.06 1.35* 1.32* 1.48*  CALCIUM 9.1 9.0 9.0 9.4 9.2   Liver Function Tests:  Recent Labs Lab 05/01/13 1237  AST 47*  ALT 41*  ALKPHOS 116  BILITOT 0.8  PROT 6.4  ALBUMIN 3.1*   No results found for this basename: LIPASE, AMYLASE,  in the last 168 hours No results found for this basename: AMMONIA,  in the last 168 hours CBC:  Recent Labs Lab 05/01/13 1237 05/02/13 0415 05/04/13 0615 05/05/13 0510  WBC 21.5* 18.0* 24.5* 22.0*  NEUTROABS  --  15.4*  --   --   HGB 11.5* 10.3* 11.2* 11.1*  HCT 37.0 33.1* 36.3 35.8*  MCV 73.6* 72.9* 73.0* 74.0*  PLT 441* 401* 442* 418*   Cardiac Enzymes: No results found for this basename: CKTOTAL,  CKMB, CKMBINDEX, TROPONINI,  in the last 168 hours BNP (last 3 results)  Recent Labs  05/01/13 1237  PROBNP 12469.0*   CBG: No results found for this basename: GLUCAP,  in the last 168 hours  No results found for this or any previous visit (from the past 240 hour(s)).   Studies: No results found.  Scheduled Meds: . antiseptic oral rinse  15 mL Mouth Rinse q12n4p  . aspirin EC  81 mg Oral Daily  . calcium-vitamin D  1 tablet Oral BID  . chlorhexidine  15 mL Mouth Rinse BID  . cholecalciferol  1,000 Units Oral Daily  . divalproex  125 mg Oral Daily  . donepezil  10 mg Oral QHS  . ferrous sulfate  325 mg Oral Q breakfast  . memantine  10 mg Oral BID  . metolazone  2.5 mg Oral Daily  . metoprolol tartrate  12.5 mg Oral BID  . multivitamin with minerals  1 tablet Oral Daily  . piperacillin-tazobactam (ZOSYN)  IV  3.375 g Intravenous Q8H  . sodium chloride  3 mL Intravenous Q12H  . vancomycin  750 mg Intravenous Q24H   Continuous Infusions: . furosemide (LASIX) infusion 8 mg/hr (05/04/13 0818)     Radonna Ricker Rosine Beat  [facility] Pager [phone]. If 8PM-8AM, please contact night-coverage at [email or web address], password [facility] 05/05/2013, 8:48 AM  LOS: 4 days

## 2013-05-05 NOTE — Progress Notes (Signed)
Pt need for foley assessed, Pt on lasix drip following strict I&O. Pt has Alzheimer dementia and is a high fall risk. NT instructed to preform foley and peri care .

## 2013-05-06 LAB — BASIC METABOLIC PANEL
CO2: 38 mEq/L — ABNORMAL HIGH (ref 19–32)
Calcium: 9.3 mg/dL (ref 8.4–10.5)
Chloride: 93 mEq/L — ABNORMAL LOW (ref 96–112)
Creatinine, Ser: 1.52 mg/dL — ABNORMAL HIGH (ref 0.50–1.10)
GFR calc Af Amer: 34 mL/min — ABNORMAL LOW (ref >90)
Glucose, Bld: 94 mg/dL (ref 70–99)
Sodium: 140 mEq/L (ref 135–145)

## 2013-05-06 LAB — MAGNESIUM: Magnesium: 2 mg/dL (ref 1.5–2.5)

## 2013-05-06 LAB — VANCOMYCIN, TROUGH: Vancomycin Tr: 14.9 ug/mL (ref 10.0–20.0)

## 2013-05-06 MED ORDER — FUROSEMIDE 40 MG PO TABS
40.0000 mg | ORAL_TABLET | Freq: Two times a day (BID) | ORAL | Status: DC
  Administered 2013-05-06: 40 mg via ORAL
  Filled 2013-05-06 (×4): qty 1

## 2013-05-06 MED ORDER — OXYCODONE-ACETAMINOPHEN 5-325 MG PO TABS
1.0000 | ORAL_TABLET | Freq: Once | ORAL | Status: AC
  Administered 2013-05-06: 1 via ORAL
  Filled 2013-05-06: qty 1

## 2013-05-06 MED ORDER — METOPROLOL TARTRATE 25 MG PO TABS
25.0000 mg | ORAL_TABLET | Freq: Two times a day (BID) | ORAL | Status: DC
  Administered 2013-05-06 – 2013-05-08 (×4): 25 mg via ORAL
  Filled 2013-05-06 (×6): qty 1

## 2013-05-06 MED ORDER — DOXYCYCLINE HYCLATE 50 MG PO CAPS: 100.0000 mg | ORAL_CAPSULE | Freq: Two times a day (BID) | ORAL | Status: AC

## 2013-05-06 MED ORDER — FUROSEMIDE 40 MG PO TABS: 40.0000 mg | ORAL_TABLET | Freq: Two times a day (BID) | ORAL | Status: DC

## 2013-05-06 MED ORDER — SODIUM CHLORIDE 0.9 % IV BOLUS (SEPSIS)
500.0000 mL | Freq: Once | INTRAVENOUS | Status: AC
  Administered 2013-05-06: 500 mL via INTRAVENOUS

## 2013-05-06 MED ORDER — METOPROLOL TARTRATE 25 MG PO TABS: 25.0000 mg | ORAL_TABLET | Freq: Two times a day (BID) | ORAL | Status: DC

## 2013-05-06 MED ORDER — SODIUM CHLORIDE 0.9 % IV BOLUS (SEPSIS): 500.0000 mL | Freq: Once | INTRAVENOUS | Status: DC

## 2013-05-06 NOTE — Progress Notes (Signed)
Progress Note from the Palliative Medicine Team at [facility]  Subjective:  Patient is OOB to chair, NAD, daughter at bedsdie  -continued conversation regarding GOC and options   Objective: No Known Allergies Scheduled Meds: . antiseptic oral rinse  15 mL Mouth Rinse q12n4p  . aspirin EC  81 mg Oral Daily  . calcium-vitamin D  1 tablet Oral BID  . chlorhexidine  15 mL Mouth Rinse BID  . cholecalciferol  1,000 Units Oral Daily  . divalproex  125 mg Oral Daily  . donepezil  10 mg Oral QHS  . ferrous sulfate  325 mg Oral Q breakfast  . memantine  10 mg Oral BID  . metoprolol tartrate  25 mg Oral BID  . multivitamin with minerals  1 tablet Oral Daily  . piperacillin-tazobactam (ZOSYN)  IV  3.375 g Intravenous Q8H  . vancomycin  750 mg Intravenous Q24H   Continuous Infusions:  PRN Meds:.acetaminophen, ondansetron (ZOFRAN) IV  BP 75/46  Pulse 99  Temp(Src) 98.6 F (37 C) (Oral)  Resp 16  Ht 5\' 5"  (1.651 m)  Wt 56.3 kg (124 lb 1.9 oz)  BMI 20.65 kg/m2  SpO2 98%   PPS:30 %  Pain Score:denies    Intake/Output Summary (Last 24 hours) at 05/06/13 1654 Last data filed at 05/06/13 1500  Gross per 24 hour  Intake    730 ml  Output   1750 ml  Net  -1020 ml       Physical Exam:  General: frail, elderly female, OOB to chair  NAD HEENT:  Mm, no exudate Chest:   Diminished in bases  CTA CVS: RRR Abdomen:soft NT +BS Ext: trace BLE edema Neuro: oriented to person openly  Labs: CBC    Component Value Date/Time   WBC 22.0* 05/05/2013 0510   WBC 22.6* 04/17/2013 1559   RBC 4.84 05/05/2013 0510   RBC 5.08 04/17/2013 1559   HGB 11.1* 05/05/2013 0510   HCT 35.8* 05/05/2013 0510   PLT 418* 05/05/2013 0510   MCV 74.0* 05/05/2013 0510   MCH 22.9* 05/05/2013 0510   MCH 22.2* 04/17/2013 1559   MCHC 31.0 05/05/2013 0510   MCHC 29.7* 04/17/2013 1559   RDW 22.9* 05/05/2013 0510   RDW 20.3* 04/17/2013 1559   LYMPHSABS 1.3 05/02/2013 0415   LYMPHSABS 1.3 04/17/2013 1559   MONOABS  1.1* 05/02/2013 0415   EOSABS 0.2 05/02/2013 0415   EOSABS 0.1 04/17/2013 1559   BASOSABS 0.0 05/02/2013 0415   BASOSABS 0.1 04/17/2013 1559    BMET    Component Value Date/Time   NA 140 05/06/2013 0450   NA 144 04/17/2013 1559   K 3.2* 05/06/2013 0450   CL 93* 05/06/2013 0450   CO2 38* 05/06/2013 0450   GLUCOSE 94 05/06/2013 0450   GLUCOSE 126* 04/17/2013 1559   BUN 25* 05/06/2013 0450   BUN 39* 04/17/2013 1559   CREATININE 1.52* 05/06/2013 0450   CALCIUM 9.3 05/06/2013 0450   GFRNONAA 29* 05/06/2013 0450   GFRAA 34* 05/06/2013 0450    CMP     Component Value Date/Time   NA 140 05/06/2013 0450   NA 144 04/17/2013 1559   K 3.2* 05/06/2013 0450   CL 93* 05/06/2013 0450   CO2 38* 05/06/2013 0450   GLUCOSE 94 05/06/2013 0450   GLUCOSE 126* 04/17/2013 1559   BUN 25* 05/06/2013 0450   BUN 39* 04/17/2013 1559   CREATININE 1.52* 05/06/2013 0450   CALCIUM 9.3 05/06/2013 0450   PROT 6.4 05/01/2013 1237  PROT 6.4 04/17/2013 1559   ALBUMIN 3.1* 05/01/2013 1237   AST 47* 05/01/2013 1237   ALT 41* 05/01/2013 1237   ALKPHOS 116 05/01/2013 1237   BILITOT 0.8 05/01/2013 1237   GFRNONAA 29* 05/06/2013 0450   GFRAA 34* 05/06/2013 0450     Assessment and Plan: 1. Code Status: DNR/DNI-comfort is priority of care  2. Symptom Control: 1. Weakness: PT/OT evaluation and treatment to determine appropriateness of SNF for rehab 2. Dyspnea: medically management at this time, discussed use of opioids with daughter  42. Psycho/Social:  Emotional support offered todaughter  4. Disposition:  Hopeful for disposition to SNF with Palliative care services to follow.  Once dc focus of care is comfort.  MOST form completed  Patient Documents Completed or Given: Document Given Completed  Advanced Directives Pkt    MOST  yes  DNR    Gone from [facility]    Hard Choices      Time In Time Out Total Time Spent with Patient Total Overall Time  1415 1450 35 min 35 min    Greater than 50%  of this time was  spent counseling and coordinating care related to the above assessment and plan.  Lorinda Creed NP  Palliative Medicine Team Team Phone # [phone] Pager [phone]   1

## 2013-05-06 NOTE — Progress Notes (Signed)
[facility] PROGRESS NOTE Interim History: 79 year old female with advanced dementia who was on xarelto for B/l PE diagnosed in June of this year was brought in by her daughter for dyspnea on exertion for 7-10 days. Patient was also noted to have increased swelling in her legs R>L Since 3 weeks and she was seen by her PCP about 2 weeks back and given a 10 day course of oral doxycycline. During that time patient also had BRBPR for which her xarelto was held. Hemoglobin was stable. Patient did not have further symptoms.   Filed Weights   05/03/13 0500 05/05/13 0630 05/06/13 0434  Weight: 65 kg (143 lb 4.8 oz) 58.5 kg (128 lb 15.5 oz) 56.3 kg (124 lb 1.9 oz)        Intake/Output Summary (Last 24 hours) at 05/06/13 0745 Last data filed at 05/06/13 0657  Gross per 24 hour  Intake 1114.6 ml  Output   3000 ml  Net -1885.4 ml     Assessment/Plan: Acute exacerbation of diastolic CHF (congestive heart failure) - Good urine output. Weight cont to decrease, no appreciated JVD or lower extremity edema. - Replete electrolytes as needed. b-met in am. - change lasix to oral - Strict I and O's, daily weights.  Severe Ao. Stenosis: - Critical stenosis due to severely thickened and calcified leaflets with a valve area of 0.39 cm square on ECHO on 6.2014  - Given advanced age would not be a surgical candidate. - family will like to meet with Palliative care, consult PMT for goals of care.  Sepsis/ Cellulitis of leg without foot - On vanc 10.30.2014. Change to doxy for 5 more days. - afebrile, WBC decreasing. - negative for DVT.  New onset a-fib - rate controlled, no on anticoagulation due to melanotic stools.   Dementia: - stable cont home meds.   Code Status: DNR as per daughter  Family Communication: daughter at bedside  Disposition Plan: inpatient     Consultants:  none  Procedures: ECHO: 6.5.2014: ejection fraction was in the range of 60% to 65%. Wall motion was normal;  there were no regional wall motion abnormalities. Doppler parameters are consistent with abnormal left ventricular relaxation (grade 1 diastolic dysfunction).   Antibiotics:  Vanc 10.30.2014 11.4.2014.  HPI/Subjective: No complains.  Objective: Filed Vitals:   05/05/13 1410 05/05/13 2019 05/05/13 2055 05/06/13 0434  BP: 104/59 102/70 105/64 100/71  Pulse: 100 51 103 58  Temp: 98.7 F (37.1 C) 98.2 F (36.8 C) 98.1 F (36.7 C) 98.3 F (36.8 C)  TempSrc: Oral Oral Oral Oral  Resp: 20 20 20 20   Height:      Weight:    56.3 kg (124 lb 1.9 oz)  SpO2: 97% 91% 95% 98%   Filed Weights   05/03/13 0500 05/05/13 0630 05/06/13 0434  Weight: 65 kg (143 lb 4.8 oz) 58.5 kg (128 lb 15.5 oz) 56.3 kg (124 lb 1.9 oz)     Exam:  General: Alert, awake, oriented x2, in no acute distress.  HEENT: flat JVD Heart: Regular rate and rhythm, no edema Lungs: Good air movement, bilateral air movement.  Abdomen: Soft, nontender, nondistended, positive bowel sounds.  Neuro: Grossly intact, nonfocal.   Data Reviewed: Basic Metabolic Panel:  Recent Labs Lab 05/02/13 0415 05/03/13 0510 05/04/13 0615 05/05/13 0510 05/06/13 0450  NA 143 140 138 140 140  K 3.5 3.4* 3.6 4.3 3.2*  CL 102 100 98 97 93*  CO2 26 26 28  34* 38*  GLUCOSE 93 96 123*  131* 94  BUN 24* 28* 27* 27* 25*  CREATININE 1.06 1.35* 1.32* 1.48* 1.52*  CALCIUM 9.0 9.0 9.4 9.2 9.3  MG  --   --   --   --  2.0   Liver Function Tests:  Recent Labs Lab 05/01/13 1237  AST 47*  ALT 41*  ALKPHOS 116  BILITOT 0.8  PROT 6.4  ALBUMIN 3.1*   No results found for this basename: LIPASE, AMYLASE,  in the last 168 hours No results found for this basename: AMMONIA,  in the last 168 hours CBC:  Recent Labs Lab 05/01/13 1237 05/02/13 0415 05/04/13 0615 05/05/13 0510  WBC 21.5* 18.0* 24.5* 22.0*  NEUTROABS  --  15.4*  --   --   HGB 11.5* 10.3* 11.2* 11.1*  HCT 37.0 33.1* 36.3 35.8*  MCV 73.6* 72.9* 73.0* 74.0*  PLT 441*  401* 442* 418*   Cardiac Enzymes: No results found for this basename: CKTOTAL, CKMB, CKMBINDEX, TROPONINI,  in the last 168 hours BNP (last 3 results)  Recent Labs  05/01/13 1237  PROBNP 12469.0*   CBG: No results found for this basename: GLUCAP,  in the last 168 hours  No results found for this or any previous visit (from the past 240 hour(s)).   Studies: No results found.  Scheduled Meds: . antiseptic oral rinse  15 mL Mouth Rinse q12n4p  . aspirin EC  81 mg Oral Daily  . calcium-vitamin D  1 tablet Oral BID  . chlorhexidine  15 mL Mouth Rinse BID  . cholecalciferol  1,000 Units Oral Daily  . divalproex  125 mg Oral Daily  . donepezil  10 mg Oral QHS  . ferrous sulfate  325 mg Oral Q breakfast  . memantine  10 mg Oral BID  . metoprolol tartrate  12.5 mg Oral BID  . multivitamin with minerals  1 tablet Oral Daily  . piperacillin-tazobactam (ZOSYN)  IV  3.375 g Intravenous Q8H  . sodium chloride  3 mL Intravenous Q12H  . vancomycin  750 mg Intravenous Q24H   Continuous Infusions:     Marinda Elk  [facility] Pager [phone]. If 8PM-8AM, please contact night-coverage at [email or web address], password [facility] 05/06/2013, 7:45 AM  LOS: 5 days

## 2013-05-06 NOTE — Progress Notes (Signed)
Patient's daughter alerted nurse to increased pain and swelling in RLE where cellulitis and blister are located.  Area has been marked and measured at 14.5cm x 9cm.  Initial measurements were 5cm x 6cm.  NP on call notified and presented to room to assess patient.  Patient has intact pulses.  Right foot was assessed as reddened but after elevation color returned to normal as compared to left foot.  Given PO pain medication for comfort.  Will have RLE re-evaluated by wound care nurse today.  Swelling has not gone outside of marked borders at present.  Will continue to monitor.  Idolina Primer RN

## 2013-05-06 NOTE — Progress Notes (Signed)
ANTIBIOTIC CONSULT NOTE - FOLLOW UP  Pharmacy Consult for Vancomycin  Indication: pneumonia and Cellulitis  No Known Allergies  Patient Measurements: Height: 5\' 5"  (165.1 cm) Weight: 128 lb 15.5 oz (58.5 kg) IBW/kg (Calculated) : 57  Vital Signs: Temp: 98.1 F (36.7 C) (11/03 2055) Temp src: Oral (11/03 2055) BP: 105/64 mmHg (11/03 2055) Pulse Rate: 103 (11/03 2055) Intake/Output from previous day: 11/03 0701 - 11/04 0700 In: 994.6 [P.O.:600; I.V.:344.6; IV Piggyback:50] Out: 2400 [Urine:2400] Intake/Output from this shift: Total I/O In: 3 [I.V.:3] Out: 300 [Urine:300]  Labs:  Recent Labs  05/03/13 0510 05/03/13 1040 05/04/13 0615 05/05/13 0510  WBC  --   --  24.5* 22.0*  HGB  --   --  11.2* 11.1*  PLT  --   --  442* 418*  LABCREA  --  106.94  --   --   CREATININE 1.35*  --  1.32* 1.48*   Estimated Creatinine Clearance: 22.7 ml/min (by C-G formula based on Cr of 1.48).  Recent Labs  05/05/13 2314  VANCOTROUGH 14.9    Anti-infectives   Start     Dose/Rate Route Frequency Ordered Stop   05/04/13 1315  piperacillin-tazobactam (ZOSYN) IVPB 3.375 g     3.375 g 12.5 mL/hr over 240 Minutes Intravenous 3 times per day 05/04/13 1243     05/03/13 2359  vancomycin (VANCOCIN) IVPB 750 mg/150 ml premix     750 mg 150 mL/hr over 60 Minutes Intravenous Every 24 hours 05/03/13 1333     05/01/13 2200  vancomycin (VANCOCIN) IVPB 1000 mg/200 mL premix  Status:  Discontinued     1,000 mg 200 mL/hr over 60 Minutes Intravenous Every 24 hours 05/01/13 2005 05/03/13 1333   05/01/13 2100  piperacillin-tazobactam (ZOSYN) IVPB 3.375 g  Status:  Discontinued     3.375 g 12.5 mL/hr over 240 Minutes Intravenous Every 8 hours 05/01/13 2005 05/02/13 0852   05/01/13 1445  clindamycin (CLEOCIN) IVPB 600 mg     600 mg 100 mL/hr over 30 Minutes Intravenous  Once 05/01/13 1436 05/01/13 1615      Assessment: 79 y/o F on vancomycin/zosyn for PNA/cellulitis. WBC elevated at 22, also  noted rising Scr (1.48<1.32). Vancomycin trough tonight is 14.9 (goal 15-20)--giving rising Scr, will not change dose at this time.   Goal of Therapy:  Vancomycin trough level 15-20 mcg/ml  Plan:  -Continue vancomycin 750 mg IV q24h -Zosyn per previous note -Trend WBC, temp, renal function -May need repeat level soon depending on renal function -F/U clinical response  Thank you for allowing me to take part in this patient's care,  Abran Duke, PharmD Clinical Pharmacist Phone: [phone] Pager: [phone] 05/06/2013 1:28 AM

## 2013-05-06 NOTE — Clinical Social Work Note (Addendum)
Clinical Social Work Department CLINICAL SOCIAL WORK PLACEMENT NOTE 05/06/2013  Patient:  EMINE, LOPATA  Account Number:  [id number] Admit date:  05/01/2013  Clinical Social Worker:  Macario Golds, LCSW  Date/time:  05/06/2013 12:00 N  Clinical Social Work is seeking post-discharge placement for this patient at the following level of care:   SKILLED NURSING   (*CSW will update this form in Epic as items are completed)   05/06/2013  Patient/family provided with [facility] Department of Clinical Social Work's list of facilities offering this level of care within the geographic area requested by the patient (or if unable, by the patient's family).  05/06/2013  Patient/family informed of their freedom to choose among providers that offer the needed level of care, that participate in Medicare, Medicaid or managed care program needed by the patient, have an available bed and are willing to accept the patient.  05/06/2013  Patient/family informed of [facility]' ownership interest in [facility], as well as of the fact that they are under no obligation to receive care at this facility.  PASARR submitted to EDS on 05/06/2013 PASARR number received from EDS on 05/06/2013  FL2 transmitted to all facilities in geographic area requested by pt/family on  05/06/2013 FL2 transmitted to all facilities within larger geographic area on   Patient informed that his/her managed care company has contracts with or will negotiate with  certain facilities, including the following:     Patient/family informed of bed offers received:  05/07/2013 Patient chooses bed at  [facility] Physician recommends and patient chooses bed at    Patient to be transferred to [facility] on  05/08/2013   Patient to be transferred to facility by  Ambulance  The following physician request were entered in Epic:   Additional Comments: 11/04 Initiating [facility] Medicare Authorization

## 2013-05-06 NOTE — Clinical Social Work Note (Signed)
Documents sent for [facility] auth via Providerlink.  Santa Genera, LCSW Clinical Social Worker [phone])

## 2013-05-06 NOTE — Discharge Summary (Addendum)
Physician Discharge Summary  Caroline Schroeder WUJ:[id number] DOB: [birth year] DOA: 05/01/2013  PCP: Bufford Spikes, DO  Admit date: 05/01/2013 Discharge date: 05/07/2013  Time spent: 35 minutes  Recommendations for Outpatient Follow-up:  1. Follow up with PCP in 1 week.  2. Daily weight, b-met in 1 week.  Discharge Diagnoses:  Principal Problem:   Acute diastolic heart failure Active Problems:   Dementia   Cellulitis of leg without foot   New onset a-fib   Sepsis   Aortic stenosis, severe   Palliative care encounter   Weakness generalized   Dyspnea   Discharge Condition: guarded  Diet recommendation: low sodium diet  Filed Weights   05/05/13 0630 05/06/13 0434 05/07/13 0523  Weight: 58.5 kg (128 lb 15.5 oz) 56.3 kg (124 lb 1.9 oz) 56.7 kg (125 lb)    History of present illness:  79 year old female with advanced dementia who was on xarelto for B/l PE diagnosed in June of this year was brought in by her daughter for dyspnea on exertion for 7-10 days. Patient was also noted to have increased swelling in her legs R>L Since 3 weeks and she was seen by her PCP about 2 weeks back and given a 10 day course of oral doxycycline. During that time patient also had BRBPR for which her xarelto was held. Hemoglobin was stable. Patient did not have further symptoms.  for last 7-10 days patient was noted to have increased dyspnea on exertion initially to climb stairs followed by difficulty walking on flat surface as well. Patient had baseline he is independently mobile. Caroline Schroeder also noticed increased leg swelling with weeping of the legs and blisters over the right leg for past 3-4 days. She denies any fever, chills, nausea, vomiting, complained of chest pain, palpitations, abdominal pain, dysuria, diarrhea or loss of appetite. Denies change in metal stratus. At baseline patient is quite demented and oriented to self only.    Hospital Course:  Acute exacerbation of diastolic CHF (congestive heart  failure): - start on IV lasix ggt. Electrolytes monitor and repleted as needed.  - Good urine output. Weight  decrease,  - change lasix to oral  - Strict I and O's, daily weights at facility.  Severe Ao. Stenosis:  - Critical stenosis due to severely thickened and calcified leaflets with a valve area of 0.39 cm square on ECHO on 6.2014  - Given advanced age would not be a surgical candidate.  - family will like to meet with Palliative care, consult PMT for goals of care.  - PT DNR/DNI.  Sepsis/ Cellulitis of leg without foot  - On vanc 10.30.2014. Change to doxy for 5 more days.  - afebrile, WBC decreasing.  ? If some MPN disorder. - negative for DVT.   New onset a-fib  - rate controlled, no on anticoagulation due to melanotic stools.   Dementia:  - stable cont home meds.    Procedures:  CT angio  Consultations:  Lower extremity doppler  Discharge Exam: Filed Vitals:   05/07/13 0523  BP: 92/51  Pulse: 100  Temp: 97.4 F (36.3 C)  Resp: 18    General: A&O x3 Cardiovascular: RRR Respiratory: good air movement CTA B/L  Discharge Instructions      Discharge Orders   Future Appointments Provider Department Dept Phone   05/12/2013 1:45 PM Kermit Balo, DO [facility] [phone]   Future Orders Complete By Expires   Diet - low sodium heart healthy  As directed    Increase activity slowly  As directed        Medication List    STOP taking these medications       amLODipine 5 MG tablet  Commonly known as:  NORVASC      TAKE these medications       aspirin EC 81 MG tablet  Take 81 mg by mouth every evening.     calcium-vitamin D 500-200 MG-UNIT per tablet  Commonly known as:  OSCAL WITH D  Take 1 tablet by mouth 2 (two) times daily.     cholecalciferol 1000 UNITS tablet  Commonly known as:  VITAMIN D  Take 1,000 Units by mouth daily.     divalproex 125 MG capsule  Commonly known as:  DEPAKOTE SPRINKLE  Take 125 mg by mouth daily.      donepezil 10 MG tablet  Commonly known as:  ARICEPT  Take 10 mg by mouth at bedtime.     doxycycline 50 MG capsule  Commonly known as:  VIBRAMYCIN  Take 2 capsules (100 mg total) by mouth 2 (two) times daily.     ferrous sulfate 325 (65 FE) MG tablet  Take 1 tablet (325 mg total) by mouth daily with breakfast.     furosemide 40 MG tablet  Commonly known as:  LASIX  Take 1 tablet (40 mg total) by mouth daily.     memantine 10 MG tablet  Commonly known as:  NAMENDA  Take 10 mg by mouth 2 (two) times daily.     metoprolol tartrate 25 MG tablet  Commonly known as:  LOPRESSOR  Take 1 tablet (25 mg total) by mouth 2 (two) times daily.     multivitamin with minerals Tabs tablet  Take 1 tablet by mouth daily.     XARELTO 20 MG Tabs tablet  Generic drug:  Rivaroxaban  Take 20 mg by mouth once.       No Known Allergies Follow-up Information   Follow up with REED, TIFFANY, DO On 05/12/2013. (hospital follow up @ 1:45pm spoke with Nicole Cella )    Specialty:  Geriatric Medicine   Contact information:   [address] [place] [address] [phone]        The results of significant diagnostics from this hospitalization (including imaging, microbiology, ancillary and laboratory) are listed below for reference.    Significant Diagnostic Studies: Dg Chest 2 View  05/01/2013   CLINICAL DATA:  Shortness of breath. History of pulmonary embolism. Swollen legs. Deep venous thrombosis.  EXAM: CHEST  2 VIEW  COMPARISON:  CHEST x-ray 12/04/2012. Chest CT 12/04/2012.  FINDINGS: Bibasilar opacities may reflect areas of atelectasis and/or consolidation. Moderate right and small left effusion. Peripheral ill-defined opacity in the left mid lung. Cephalization of the pulmonary vasculature, without frank pulmonary edema. Mild cardiomegaly. Upper mediastinal contours are unremarkable. Atherosclerosis in the thoracic aorta. Old compression fracture of a mid thoracic vertebral body (likely T8),  unchanged.  IMPRESSION: 1. Bibasilar areas of atelectasis and/or consolidation. In the appropriate clinical setting, this could be related to sequela of aspiration, developing infection, or multifocal pulmonary infarctions. Clinical correlation is recommended. Additionally, there is some ill-defined airspace disease in the left mid lung. 2. Moderate right and small left pleural effusions. 3. Cardiomegaly with pulmonary venous congestion, but no frank pulmonary edema. 4. Atherosclerosis.   Electronically Signed   By: Trudie Reed M.D.   On: 05/01/2013 13:35   Ct Angio Chest Pe W/cm &/or Wo Cm  05/01/2013   CLINICAL DATA:  Lower edema swelling ;  shortness of breath ; history of previous pulmonary emboli  EXAM: CT ANGIOGRAPHY CHEST WITH CONTRAST  TECHNIQUE: Multidetector CT imaging of the chest was performed using the standard protocol during bolus administration of intravenous contrast. Multiplanar CT image reconstructions including MIPs were obtained to evaluate the vascular anatomy.  CONTRAST:  OMNIPAQUE IOHEXOL 350 MG/ML SOLN  COMPARISON:  Chest radiograph May 01, 2013 and chest CT angiogram December 04, 2012  FINDINGS: There are no but demonstrable pulmonary emboli. There is no thoracic aortic aneurysm. There is atherosclerotic change throughout the aorta.  There are moderate pleural effusions bilaterally, larger on the right than on the left. There is cardiomegaly. Pericardium is not thickened.  There are areas of patchy airspace opacity in both lower lobes as well as in the inferior lingula and right middle lobe medially. There is mild interstitial edema in the bases is well.  There is no appreciable thoracic adenopathy.  The the limited visualization the upper abdomen shows no abnormality.  There are no blastic or lytic bone lesions. There is anterior wedging of the T10 vertebral body, stable from prior study. Thyroid appears unremarkable. The  Review of the MIP images confirms the above findings.   IMPRESSION: Congestive heart failure. Areas of superimposed pneumonia in the lingula and right upper lobe cannot be excluded. No demonstrable pulmonary embolus.   Electronically Signed   By: Bretta Bang M.D.   On: 05/01/2013 16:54    Microbiology: No results found for this or any previous visit (from the past 240 hour(s)).   Labs: Basic Metabolic Panel:  Recent Labs Lab 05/03/13 0510 05/04/13 0615 05/05/13 0510 05/06/13 0450 05/07/13 0324  NA 140 138 140 140 139  K 3.4* 3.6 4.3 3.2* 3.3*  CL 100 98 97 93* 94*  CO2 26 28 34* 38* 37*  GLUCOSE 96 123* 131* 94 117*  BUN 28* 27* 27* 25* 31*  CREATININE 1.35* 1.32* 1.48* 1.52* 1.73*  CALCIUM 9.0 9.4 9.2 9.3 9.1  MG  --   --   --  2.0  --    Liver Function Tests:  Recent Labs Lab 05/01/13 1237  AST 47*  ALT 41*  ALKPHOS 116  BILITOT 0.8  PROT 6.4  ALBUMIN 3.1*   No results found for this basename: LIPASE, AMYLASE,  in the last 168 hours No results found for this basename: AMMONIA,  in the last 168 hours CBC:  Recent Labs Lab 05/01/13 1237 05/02/13 0415 05/04/13 0615 05/05/13 0510  WBC 21.5* 18.0* 24.5* 22.0*  NEUTROABS  --  15.4*  --   --   HGB 11.5* 10.3* 11.2* 11.1*  HCT 37.0 33.1* 36.3 35.8*  MCV 73.6* 72.9* 73.0* 74.0*  PLT 441* 401* 442* 418*   Cardiac Enzymes: No results found for this basename: CKTOTAL, CKMB, CKMBINDEX, TROPONINI,  in the last 168 hours BNP: BNP (last 3 results)  Recent Labs  05/01/13 1237  PROBNP 12469.0*   CBG: No results found for this basename: GLUCAP,  in the last 168 hours     Signed:  Marinda Elk  [facility] 05/07/2013, 7:39 AM

## 2013-05-06 NOTE — Progress Notes (Signed)
05/05/2013 7p-7a shift. Pt.is A/Ox1 and is incontinent of bowel and urine. She had no signs of distress and no c/o pain during the shift. Her daughter remained at the bedside throughout the shift. Pt.had a 5 beat run of v-tach. VS were taken and patient was asymptomatic. Mid level provider with [facility] was paged and notified. Order written for magnesium level. Pt.'s foley catheter was leaking urine so foley was discontinued and new foley catheter was placed. Orders for IV lasix drip was discontinued this am around 0500.

## 2013-05-06 NOTE — Progress Notes (Signed)
SATURATION QUALIFICATIONS: (This note is used to comply with regulatory documentation for home oxygen)  Patient Saturations on Room Air at Rest = 90%  Patient Saturations on Room Air while Ambulating = 78%  Patient Saturations on 2 Liters of oxygen while Ambulating = 95%  Please briefly explain why patient needs home oxygen:Pt desats with activity on RA.  Also sat at 90% on RA at rest.  Thanks.   [facility] [phone] (pager)

## 2013-05-06 NOTE — Clinical Social Work Note (Signed)
Clinical Social Work Department BRIEF PSYCHOSOCIAL ASSESSMENT 05/06/2013  Patient:  Caroline Schroeder, Caroline Schroeder     Account Number:  [id number]     Admit date:  05/01/2013  Clinical Social Worker:  Verl Blalock  Date/Time:  05/05/2013 04:40 PM  Referred by:  RN  Date Referred:  05/05/2013 Referred for  SNF Placement   Other Referral:   Interview type:  Family Other interview type:   Patient with severe Dementia    PSYCHOSOCIAL DATA Living Status:  FAMILY Admitted from facility:   Level of care:   Primary support name:  Anell Barr  [phone] Primary support relationship to patient:  CHILD, ADULT Degree of support available:   Strong    CURRENT CONCERNS Current Concerns  Post-Acute Placement   Other Concerns:    SOCIAL WORK ASSESSMENT / PLAN Clinical Social Worker met with patient daughter at bedside to offer support and discuss patient needs at discharge. Patient daughter states that patient has been living with her for several years and relatively independent with ADL's.  Patient daughter states that patient was able to climb the flight of stairs to her room by herself, feed herself, use the bedside commode in the middle of the night, etc.    Patient daughter states that they are closing on their home in [place] in 2 weeks and moving to [place].  Patient daughter is in agreement that patient would benefit from ST-SNF placement locally and transition to [place] if skilled needs are still needed.  CSW to initiate SNF search in [facility] with family preference to [facility].  CSW to follow up with patient daughter regarding potential bed offers once available.  CSW to submit clinical information to [facility].  CSW remains available for support and to facilitate patient discharge needs once medically ready.   Assessment/plan status:  Psychosocial Support/Ongoing Assessment of Needs Other assessment/ plan:   Information/referral to community resources:    Clinical Social Worker offered patient daughter resources - however patient daughter states that she has met with Palliative Care and plans to follow up tomorrow.  Patient daughter has reviewed MUST form and has "Hard Choices" booklet.    PATIENT'S/FAMILY'S RESPONSE TO PLAN OF CARE: Patient alert to self only laying in bed at this time. Patient daughter present at bedside and states that patient has adequate family support at home.  Patient daughter and husband provide necessary 24 hour supervison at home. Patient daughter is contemplating the idea of possible Hospice - Palliative Care to follow up.  Patient daughter understanding of social work role and verbalizes her appreciation for social work role support.

## 2013-05-06 NOTE — Progress Notes (Signed)
1630 BPs  low <85. Pt  No apparent distress . Seen and evaluated by Dr. Robb Matar . Updated pt' daiughter . NSS 500 cc bolus given fot 2 hrs  As ordered. Monitoring continues

## 2013-05-06 NOTE — Progress Notes (Signed)
1600 teport received from [facility] . Pt resting comfortably , sitting in the gerichair with daughter in attendance. Pt calm and pleasant. Watching  Television attentively

## 2013-05-06 NOTE — Progress Notes (Signed)
Physical Therapy Treatment Patient Details Name: Caroline Schroeder MRN: [id number] DOB: 1921/05/22 Today's Date: 05/06/2013 Time: 1027-2536 PT Time Calculation (min): 36 min  PT Assessment / Plan / Recommendation  History of Present Illness Pt admit with CHF.     PT Comments   Pt admitted with above. Pt currently with functional limitations due to balance and endurance deficits.  Pt will benefit from skilled PT to increase their independence and safety with mobility to allow discharge to the venue listed below.   Follow Up Recommendations  SNF;Supervision/Assistance - 24 hour                 Equipment Recommendations  None recommended by PT        Frequency Min 3X/week   Progress towards PT Goals Progress towards PT goals: Progressing toward goals  Plan Current plan remains appropriate    Precautions / Restrictions Precautions Precautions: Fall Restrictions Weight Bearing Restrictions: No   Pertinent Vitals/Pain VSS, no pain    Mobility  Bed Mobility Bed Mobility: Supine to Sit;Sit to Supine Supine to Sit: HOB elevated;3: Mod assist Sit to Supine: 3: Mod assist;HOB flat Details for Bed Mobility Assistance: max verbal/tactile cues to complete task, maxA to bring hips to EOB and assist to elevate trunk Transfers Transfers: Sit to Stand;Stand to Sit Sit to Stand: 3: Mod assist;With upper extremity assist;From bed Stand to Sit: 3: Mod assist;With upper extremity assist;To chair/3-in-1;With armrests Details for Transfer Assistance: Needed assist and cues for sit to stand and stand to sit.  Flexed posture with incr time to achieve upright posture.  Needed incr steadying assist initially upon standing as well.  Assisted pt to 3N1 initially upon standing.  Ambulation/Gait Ambulation/Gait Assistance: 4: Min assist;3: Mod assist Ambulation Distance (Feet): 150 Feet Assistive device: Rolling walker Ambulation/Gait Assistance Details: Ambulates with mod assisst to steer RW as well  as for sequencing steps. Cues needed for safety with RW.  Shuffles feet unless cued.  Pt needed cues to stand erect as well.  Pt desats with activity without O2 as well.   Gait Pattern: Step-to pattern;Decreased stride length;Shuffle;Festinating;Wide base of support;Trunk flexed Gait velocity: decreased General Gait Details: pt at significant falls risk Stairs: No Wheelchair Mobility Wheelchair Mobility: No    Exercises General Exercises - Lower Extremity Long Arc Quad: AROM;Both;10 reps;Seated Hip Flexion/Marching: AROM;Both;10 reps;Seated   PT Goals (current goals can now be found in the care plan section)    Visit Information  Last PT Received On: 05/06/13 Assistance Needed: +1 History of Present Illness: Pt admit with CHF.      Subjective Data  Subjective: "I feel like I can try."   Cognition  Cognition Arousal/Alertness: Lethargic Behavior During Therapy: Flat affect Overall Cognitive Status: History of cognitive impairments - at baseline    Balance  Static Sitting Balance Static Sitting - Balance Support: Bilateral upper extremity supported Static Sitting - Level of Assistance: 4: Min assist Static Sitting - Comment/# of Minutes: 3 Static Standing Balance Static Standing - Balance Support: Bilateral upper extremity supported;During functional activity Static Standing - Level of Assistance: 3: Mod assist Static Standing - Comment/# of Minutes: stands with RW and needs steadying assist  End of Session PT - End of Session Equipment Utilized During Treatment: Gait belt;Oxygen Activity Tolerance: Patient limited by fatigue Patient left: with call bell/phone within reach;with family/visitor present;in chair (LEs elevated with HOB flat) Nurse Communication: Mobility status        INGOLD,Jailyn Leeson 05/06/2013, 4:25 PM Magen Suriano [place],PT Acute Rehabilitation  [phone] (pager)

## 2013-05-06 NOTE — Progress Notes (Signed)
1833 no apparent distress . nss bolus in  Progress. Back to bed with 2 people assist .Kept comfortable in bed. Safety precautions in effect . Daughter at bedside

## 2013-05-07 LAB — BASIC METABOLIC PANEL
BUN: 31 mg/dL — ABNORMAL HIGH (ref 6–23)
GFR calc non Af Amer: 25 mL/min — ABNORMAL LOW (ref >90)
Glucose, Bld: 117 mg/dL — ABNORMAL HIGH (ref 70–99)
Potassium: 3.3 mEq/L — ABNORMAL LOW (ref 3.5–5.1)
Sodium: 139 mEq/L (ref 135–145)

## 2013-05-07 MED ORDER — PIPERACILLIN-TAZOBACTAM IN DEX 2-0.25 GM/50ML IV SOLN
2.2500 g | Freq: Three times a day (TID) | INTRAVENOUS | Status: DC
  Administered 2013-05-07 – 2013-05-08 (×3): 2.25 g via INTRAVENOUS
  Filled 2013-05-07 (×5): qty 50

## 2013-05-07 MED ORDER — FUROSEMIDE 40 MG PO TABS: 40.0000 mg | ORAL_TABLET | Freq: Every day | ORAL | Status: AC

## 2013-05-07 MED ORDER — METOPROLOL TARTRATE 12.5 MG HALF TABLET
12.5000 mg | ORAL_TABLET | Freq: Once | ORAL | Status: AC
  Administered 2013-05-07: 12.5 mg via ORAL
  Filled 2013-05-07: qty 1

## 2013-05-07 MED ORDER — VANCOMYCIN HCL 500 MG IV SOLR
500.0000 mg | INTRAVENOUS | Status: DC
  Filled 2013-05-07: qty 500

## 2013-05-07 MED ORDER — SODIUM CHLORIDE 0.9 % IV BOLUS (SEPSIS)
250.0000 mL | Freq: Once | INTRAVENOUS | Status: AC
  Administered 2013-05-07: 11:00:00 250 mL via INTRAVENOUS

## 2013-05-07 MED ORDER — VANCOMYCIN HCL IN DEXTROSE 750-5 MG/150ML-% IV SOLN
750.0000 mg | INTRAVENOUS | Status: DC
  Administered 2013-05-08: 750 mg via INTRAVENOUS
  Filled 2013-05-07 (×2): qty 150

## 2013-05-07 NOTE — Clinical Social Work Note (Signed)
Clinical Social Worker continuing to follow patient and family for support and discharge planning needs.  CSW has provided patient daughter with bed offers and patient daughter has chosen [facility].  CSW has notified facility and [facility] of patient family facility choice for authorization.  Per MD, patient to remain overnight due to medical set back.  Patient family aware.  CSW remains available for support and to facilitate patient discharge needs once medically ready.  Macario Golds, [place] [phone]

## 2013-05-07 NOTE — Progress Notes (Signed)
[facility] PROGRESS NOTE Interim History: 79 year old female with advanced dementia who was on xarelto for B/l PE diagnosed in June of this year was brought in by her daughter for dyspnea on exertion for 7-10 days. Patient was also noted to have increased swelling in her legs R>L Since 3 weeks and she was seen by her PCP about 2 weeks back and given a 10 day course of oral doxycycline. During that time patient also had BRBPR for which her xarelto was held. Hemoglobin was stable. Patient did not have further symptoms.   Filed Weights   05/05/13 0630 05/06/13 0434 05/07/13 0523  Weight: 58.5 kg (128 lb 15.5 oz) 56.3 kg (124 lb 1.9 oz) 56.7 kg (125 lb)        Intake/Output Summary (Last 24 hours) at 05/07/13 1325 Last data filed at 05/07/13 1014  Gross per 24 hour  Intake   1150 ml  Output    302 ml  Net    848 ml     Assessment/Plan: Acute exacerbation of diastolic CHF (congestive heart failure) - Good urine output. Weight cont to decrease, no appreciated JVD or lower extremity edema. - Replete electrolytes as needed. - change lasix to oral but holding today given hypotension. Small 250 cc bolus this morning.  - Strict I and O's, daily weights. Severe Ao. Stenosis: - Critical stenosis due to severely thickened and calcified leaflets with a valve area of 0.39 cm square on ECHO on 6.2014  - Given advanced age would not be a surgical candidate. - palliative consulted Sepsis/ Cellulitis of leg without foot - On vanc 10.30.2014. Worsening bullae last night - afebrile, WBC decreasing. - negative for DVT. New onset a-fib - rate controlled, no on anticoagulation due to melanotic stools.  Dementia: - stable cont home meds.   Code Status: DNR as per daughter  Family Communication: daughter at bedside  Disposition Plan: inpatient, likely d/c tomorrow.   Consultants:  none  Procedures: ECHO: 6.5.2014: ejection fraction was in the range of 60% to 65%. Wall motion was normal;  there were no regional wall motion abnormalities. Doppler parameters are consistent with abnormal left ventricular relaxation (grade 1 diastolic dysfunction).   Antibiotics:  Vanc 05/01/2013 >>  Zosyn 05/01/2013 >>  HPI/Subjective: No complains.  Objective: Filed Vitals:   05/06/13 2037 05/06/13 2139 05/07/13 0523 05/07/13 0956  BP: 99/50  92/51 123/68  Pulse: 52 106 100 89  Temp: 98.5 F (36.9 C)  97.4 F (36.3 C)   TempSrc: Oral  Oral   Resp: 16  18   Height:      Weight:   56.7 kg (125 lb)   SpO2: 98%  97%    Filed Weights   05/05/13 0630 05/06/13 0434 05/07/13 0523  Weight: 58.5 kg (128 lb 15.5 oz) 56.3 kg (124 lb 1.9 oz) 56.7 kg (125 lb)   Exam: General: Alert, awake, oriented x2, in no acute distress.  HEENT: flat JVD Heart: Regular rate and rhythm, no edema Lungs: Good air movement, bilateral air movement.  Abdomen: Soft, nontender, nondistended, positive bowel sounds.  Neuro: Grossly intact, nonfocal.  Data Reviewed: Basic Metabolic Panel:  Recent Labs Lab 05/03/13 0510 05/04/13 0615 05/05/13 0510 05/06/13 0450 05/07/13 0324  NA 140 138 140 140 139  K 3.4* 3.6 4.3 3.2* 3.3*  CL 100 98 97 93* 94*  CO2 26 28 34* 38* 37*  GLUCOSE 96 123* 131* 94 117*  BUN 28* 27* 27* 25* 31*  CREATININE 1.35* 1.32* 1.48* 1.52*  1.73*  CALCIUM 9.0 9.4 9.2 9.3 9.1  MG  --   --   --  2.0  --    Liver Function Tests:  Recent Labs Lab 05/01/13 1237  AST 47*  ALT 41*  ALKPHOS 116  BILITOT 0.8  PROT 6.4  ALBUMIN 3.1*   CBC:  Recent Labs Lab 05/01/13 1237 05/02/13 0415 05/04/13 0615 05/05/13 0510  WBC 21.5* 18.0* 24.5* 22.0*  NEUTROABS  --  15.4*  --   --   HGB 11.5* 10.3* 11.2* 11.1*  HCT 37.0 33.1* 36.3 35.8*  MCV 73.6* 72.9* 73.0* 74.0*  PLT 441* 401* 442* 418*   BNP (last 3 results)  Recent Labs  05/01/13 1237  PROBNP 12469.0*   Studies: No results found.  Scheduled Meds: . antiseptic oral rinse  15 mL Mouth Rinse q12n4p  . aspirin EC   81 mg Oral Daily  . calcium-vitamin D  1 tablet Oral BID  . chlorhexidine  15 mL Mouth Rinse BID  . cholecalciferol  1,000 Units Oral Daily  . divalproex  125 mg Oral Daily  . donepezil  10 mg Oral QHS  . ferrous sulfate  325 mg Oral Q breakfast  . memantine  10 mg Oral BID  . metoprolol tartrate  25 mg Oral BID  . multivitamin with minerals  1 tablet Oral Daily  . piperacillin-tazobactam (ZOSYN)  IV  3.375 g Intravenous Q8H  . vancomycin  750 mg Intravenous Q24H   Continuous Infusions:    Pamella Pert  [facility] Pager [phone]. If 7 PM - 7 AM, please contact night-coverage at [email or web address], password [facility] 05/07/2013, 1:25 PM  LOS: 6 days

## 2013-05-07 NOTE — Progress Notes (Signed)
Event: 05/06/13 @ 2325:  Notified by RN that pt c/o increased pain to RLE. RN noted enlarging blister to inner RLE. RN reports the blister has doubled in size since her initial assessment. At that time she marked the parameters of the enlarging blister and there has been no increase in size.Tylenol is not helping pain. Percocet one tablet ordered. RN ask to elevated the RLE. NP to bedside. Subjective: Pt now reports pain much improved w/ elevation and percocet. Does admit to increase pain to site of RLE "blister" that she noted getting bigger.  Objective: Ms Griffo is a 79 y/o female with advanced dementia who was on xarelto for Bil PE dx'd in June of this year. She was brought in by her daughter for dyspnea on exertion for 7-10 days. Patient was also noted to have increased swelling in her legs R>L for 3 weeks and she was seen by her PCP about 2 weeks back and given a 10 day course of oral doxycycline. During that time patient also had BRBPR for which her xarelto was held. Hemoglobin was stable. Patient did not have further symptoms. Pt admitted for CHF exacerbation. Her hospitalization has been complicated by new onset a-fib, dementia and RLE cellulitis. She has been on Vanc since 05/01/13. Pt seen by WOC RN on 05/02/13 who noted a 5 x 6 cm dark purple filled blister. At bedside pt now noted resting in NAD. Noted a large bulla to (R) inner calf that now measures 14.5 cm long by 9 cm wide. It is quite taunt and is not draining. Surrounding tissue appears erythematous and w/ slight edema c/w previous observations. Pt has good palpable DP and PT pulses to (R) foot. The RLE feels warm to touch. Most recent H&H (11/3) was 11.1/35.8 and platlets 418. Pt is not currently on any anti-coagulation. Assessment/Plan: 1. Enlarged Bulla RLE: The parameters were marked when RN initially noted increasing size and there has been no further progression. Good pulses to (R) foot. Will place order for WOC RN to reassess wound  this am. Will keep RLE elevated and medicate for pain as indicated. Will defer further changes in pt's plan of care to rounding MD. Will continue to monitor closely.  Leanne Chang, NP-C [facility] Pager [phone]

## 2013-05-08 ENCOUNTER — Inpatient Hospital Stay (HOSPITAL_COMMUNITY): Payer: Medicare Other

## 2013-05-08 LAB — BASIC METABOLIC PANEL
BUN: 27 mg/dL — ABNORMAL HIGH (ref 6–23)
CO2: 36 mEq/L — ABNORMAL HIGH (ref 19–32)
Chloride: 94 mEq/L — ABNORMAL LOW (ref 96–112)
Creatinine, Ser: 1.4 mg/dL — ABNORMAL HIGH (ref 0.50–1.10)
GFR calc Af Amer: 37 mL/min — ABNORMAL LOW (ref >90)
Glucose, Bld: 92 mg/dL (ref 70–99)

## 2013-05-08 MED ORDER — METOPROLOL TARTRATE 12.5 MG HALF TABLET
12.5000 mg | ORAL_TABLET | Freq: Two times a day (BID) | ORAL | Status: DC
  Filled 2013-05-08: qty 1

## 2013-05-08 MED ORDER — METOPROLOL TARTRATE 25 MG PO TABS: 12.5000 mg | ORAL_TABLET | Freq: Two times a day (BID) | ORAL | Status: AC

## 2013-05-08 MED ORDER — POTASSIUM CHLORIDE CRYS ER 20 MEQ PO TBCR
40.0000 meq | EXTENDED_RELEASE_TABLET | Freq: Once | ORAL | Status: AC
  Administered 2013-05-08: 40 meq via ORAL
  Filled 2013-05-08: qty 2

## 2013-05-08 NOTE — Progress Notes (Signed)
Caroline Schroeder to be D/C'd Skilled nursing facility per MD order.  Discussed with the patient and all questions fully answered.    Medication List    STOP taking these medications       amLODipine 5 MG tablet  Commonly known as:  NORVASC     XARELTO 20 MG Tabs tablet  Generic drug:  Rivaroxaban      TAKE these medications       aspirin EC 81 MG tablet  Take 81 mg by mouth every evening.     calcium-vitamin D 500-200 MG-UNIT per tablet  Commonly known as:  OSCAL WITH D  Take 1 tablet by mouth 2 (two) times daily.     cholecalciferol 1000 UNITS tablet  Commonly known as:  VITAMIN D  Take 1,000 Units by mouth daily.     divalproex 125 MG capsule  Commonly known as:  DEPAKOTE SPRINKLE  Take 125 mg by mouth daily.     donepezil 10 MG tablet  Commonly known as:  ARICEPT  Take 10 mg by mouth at bedtime.     doxycycline 50 MG capsule  Commonly known as:  VIBRAMYCIN  Take 2 capsules (100 mg total) by mouth 2 (two) times daily.     ferrous sulfate 325 (65 FE) MG tablet  Take 1 tablet (325 mg total) by mouth daily with breakfast.     furosemide 40 MG tablet  Commonly known as:  LASIX  Take 1 tablet (40 mg total) by mouth daily.     memantine 10 MG tablet  Commonly known as:  NAMENDA  Take 10 mg by mouth 2 (two) times daily.     metoprolol tartrate 25 MG tablet  Commonly known as:  LOPRESSOR  Take 0.5 tablets (12.5 mg total) by mouth 2 (two) times daily.     multivitamin with minerals Tabs tablet  Take 1 tablet by mouth daily.        VVS, Skin clean, dry and intact without evidence of skin break down, no evidence of skin tears noted. IV catheter discontinued intact. Site without signs and symptoms of complications. Dressing and pressure applied.  Patient escorted via stretcher, and D/C SNF via ambulance.  Chapel Silverthorn 05/08/2013 5:08 PM

## 2013-05-08 NOTE — Progress Notes (Signed)
  Per family they are requesting Palliative care services to follow in facility. DC today to [facility]  Lorinda Creed NP  Palliative Medicine Team Team Phone # [phone] Pager [phone]

## 2013-05-08 NOTE — Discharge Summary (Addendum)
Physician Discharge Summary  Caroline Schroeder ZOX:[id number] DOB: 1921/02/23 DOA: 05/01/2013  PCP: Bufford Spikes, DO  Admit date: 05/01/2013 Discharge date: 05/08/2013  Time spent: 35 minutes  Recommendations for Outpatient Follow-up:  1. Follow up with PCP in 1 week 2. Daily weights, BMP in 3-4 days 3. Follow up with Palliative   Discharge Diagnoses:  Principal Problem:   Acute diastolic heart failure Active Problems:   Dementia   Cellulitis of leg without foot   New onset a-fib   Sepsis   Aortic stenosis, severe   Palliative care encounter   Weakness generalized   Dyspnea  Discharge Condition: guarded, with palliative  Diet recommendation: low sodium  Filed Weights   05/06/13 0434 05/07/13 0523 05/08/13 0637  Weight: 56.3 kg (124 lb 1.9 oz) 56.7 kg (125 lb) 55.58 kg (122 lb 8.5 oz)   History of present illness:  79 year old female with advanced dementia who was on xarelto for B/l PE diagnosed in June of this year was brought in by her daughter for dyspnea on exertion for 7-10 days. Patient was also noted to have increased swelling in her legs R>L Since 3 weeks and she was seen by her PCP about 2 weeks back and given a 10 day course of oral doxycycline. During that time patient also had BRBPR for which her xarelto was held. Hemoglobin was stable. Patient did not have further symptoms.  for last 7-10 days patient was noted to have increased dyspnea on exertion initially to climb stairs followed by difficulty walking on flat surface as well. Patient had baseline he is independently mobile. Bucher also noticed increased leg swelling with weeping of the legs and blisters over the right leg for past 3-4 days. She denies any fever, chills, nausea, vomiting, complained of chest pain, palpitations, abdominal pain, dysuria, diarrhea or loss of appetite. Denies change in metal stratus. At baseline patient is quite demented and oriented to self only.   Hospital Course:  Acute exacerbation  of diastolic CHF (congestive heart failure):  - start on IV lasix ggt initially then transitioned to oral. Electrolytes monitor and repleted as needed.  - Good urine output. Weight decrease,  - change lasix to oral  - Strict I and O's, daily weights at facility.  Severe Ao. Stenosis:  - Critical stenosis due to severely thickened and calcified leaflets with a valve area of 0.39 cm square on ECHO on 6.2014  - Given advanced age would not be a surgical candidate.  - family met with Palliative care, consulted PMT for goals of care.  - PT DNR/DNI.  Sepsis/ Cellulitis of leg without foot  - On vanc/zosyn 10.30.2014. Change to doxycycline for 7 more days.  - afebrile, WBC decreasing. ? If some MPN disorder.  - negative for DVT.  - recommend wound care nurse to evaluate periodically. - left pre-tibial hematoma evaluated by orthopedic surgery who recommended compressive dressing and WBAT. She is to continue oral antibiotics after discharge and will need follow up with Dr. Eulah Pont in clinic.  New onset a-fib  - rate controlled, no on anticoagulation due to melanotic stools.  Dementia:  - stable cont home meds.   Procedures:  CT angio   LE doppler  Consultations:  Palliative care  Orthopedic surgery  Discharge Exam: Filed Vitals:   05/08/13 0637 05/08/13 0700 05/08/13 1100 05/08/13 1434  BP: 98/61  100/60 100/61  Pulse: 45 101 89 83  Temp: 98.2 F (36.8 C)   97.9 F (36.6 C)  TempSrc: Oral  Oral  Resp: 17   18  Height:      Weight: 55.58 kg (122 lb 8.5 oz)     SpO2: 97%   97%    General: NAD Cardiovascular: RRR, 3/6 SEM Respiratory: CTA biL  Discharge Instructions      Discharge Orders   Future Orders Complete By Expires   Diet - low sodium heart healthy  As directed    Increase activity slowly  As directed        Medication List    STOP taking these medications       amLODipine 5 MG tablet  Commonly known as:  NORVASC     XARELTO 20 MG Tabs tablet   Generic drug:  Rivaroxaban      TAKE these medications       aspirin EC 81 MG tablet  Take 81 mg by mouth every evening.     calcium-vitamin D 500-200 MG-UNIT per tablet  Commonly known as:  OSCAL WITH D  Take 1 tablet by mouth 2 (two) times daily.     cholecalciferol 1000 UNITS tablet  Commonly known as:  VITAMIN D  Take 1,000 Units by mouth daily.     divalproex 125 MG capsule  Commonly known as:  DEPAKOTE SPRINKLE  Take 125 mg by mouth daily.     donepezil 10 MG tablet  Commonly known as:  ARICEPT  Take 10 mg by mouth at bedtime.     doxycycline 50 MG capsule  Commonly known as:  VIBRAMYCIN  Take 2 capsules (100 mg total) by mouth 2 (two) times daily.     ferrous sulfate 325 (65 FE) MG tablet  Take 1 tablet (325 mg total) by mouth daily with breakfast.     furosemide 40 MG tablet  Commonly known as:  LASIX  Take 1 tablet (40 mg total) by mouth daily.     memantine 10 MG tablet  Commonly known as:  NAMENDA  Take 10 mg by mouth 2 (two) times daily.     metoprolol tartrate 25 MG tablet  Commonly known as:  LOPRESSOR  Take 0.5 tablets (12.5 mg total) by mouth 2 (two) times daily.     multivitamin with minerals Tabs tablet  Take 1 tablet by mouth daily.       Follow-up Information   Follow up with REED, TIFFANY, DO On 05/12/2013. (hospital follow up @ 1:45pm spoke with Nicole Cella )    Specialty:  Geriatric Medicine   Contact information:   [address] [place] [address] [phone]       Follow up with Margarita Rana, D, MD. Schedule an appointment as soon as possible for a visit in 2 weeks.   Specialty:  Orthopedic Surgery   Contact information:   [address] [place] [address] [phone]       The results of significant diagnostics from this hospitalization (including imaging, microbiology, ancillary and laboratory) are listed below for reference.    Significant Diagnostic Studies: Dg Chest 2 View  05/01/2013    CLINICAL DATA:  Shortness of breath. History of pulmonary embolism. Swollen legs. Deep venous thrombosis.  EXAM: CHEST  2 VIEW  COMPARISON:  CHEST x-ray 12/04/2012. Chest CT 12/04/2012.  FINDINGS: Bibasilar opacities may reflect areas of atelectasis and/or consolidation. Moderate right and small left effusion. Peripheral ill-defined opacity in the left mid lung. Cephalization of the pulmonary vasculature, without frank pulmonary edema. Mild cardiomegaly. Upper mediastinal contours are unremarkable. Atherosclerosis in the thoracic  aorta. Old compression fracture of a mid thoracic vertebral body (likely T8), unchanged.  IMPRESSION: 1. Bibasilar areas of atelectasis and/or consolidation. In the appropriate clinical setting, this could be related to sequela of aspiration, developing infection, or multifocal pulmonary infarctions. Clinical correlation is recommended. Additionally, there is some ill-defined airspace disease in the left mid lung. 2. Moderate right and small left pleural effusions. 3. Cardiomegaly with pulmonary venous congestion, but no frank pulmonary edema. 4. Atherosclerosis.   Electronically Signed   By: Trudie Reed M.D.   On: 05/01/2013 13:35   Ct Angio Chest Pe W/cm &/or Wo Cm  05/01/2013   CLINICAL DATA:  Lower edema swelling ; shortness of breath ; history of previous pulmonary emboli  EXAM: CT ANGIOGRAPHY CHEST WITH CONTRAST  TECHNIQUE: Multidetector CT imaging of the chest was performed using the standard protocol during bolus administration of intravenous contrast. Multiplanar CT image reconstructions including MIPs were obtained to evaluate the vascular anatomy.  CONTRAST:  OMNIPAQUE IOHEXOL 350 MG/ML SOLN  COMPARISON:  Chest radiograph May 01, 2013 and chest CT angiogram December 04, 2012  FINDINGS: There are no but demonstrable pulmonary emboli. There is no thoracic aortic aneurysm. There is atherosclerotic change throughout the aorta.  There are moderate pleural effusions  bilaterally, larger on the right than on the left. There is cardiomegaly. Pericardium is not thickened.  There are areas of patchy airspace opacity in both lower lobes as well as in the inferior lingula and right middle lobe medially. There is mild interstitial edema in the bases is well.  There is no appreciable thoracic adenopathy.  The the limited visualization the upper abdomen shows no abnormality.  There are no blastic or lytic bone lesions. There is anterior wedging of the T10 vertebral body, stable from prior study. Thyroid appears unremarkable. The  Review of the MIP images confirms the above findings.  IMPRESSION: Congestive heart failure. Areas of superimposed pneumonia in the lingula and right upper lobe cannot be excluded. No demonstrable pulmonary embolus.   Electronically Signed   By: Bretta Bang M.D.   On: 05/01/2013 16:54   Labs: Basic Metabolic Panel:  Recent Labs Lab 05/04/13 0615 05/05/13 0510 05/06/13 0450 05/07/13 0324 05/08/13 0533  NA 138 140 140 139 139  K 3.6 4.3 3.2* 3.3* 3.2*  CL 98 97 93* 94* 94*  CO2 28 34* 38* 37* 36*  GLUCOSE 123* 131* 94 117* 92  BUN 27* 27* 25* 31* 27*  CREATININE 1.32* 1.48* 1.52* 1.73* 1.40*  CALCIUM 9.4 9.2 9.3 9.1 8.8  MG  --   --  2.0  --   --    Liver Function Tests: No results found for this basename: AST, ALT, ALKPHOS, BILITOT, PROT, ALBUMIN,  in the last 168 hours CBC:  Recent Labs Lab 05/02/13 0415 05/04/13 0615 05/05/13 0510  WBC 18.0* 24.5* 22.0*  NEUTROABS 15.4*  --   --   HGB 10.3* 11.2* 11.1*  HCT 33.1* 36.3 35.8*  MCV 72.9* 73.0* 74.0*  PLT 401* 442* 418*   BNP: BNP (last 3 results)  Recent Labs  05/01/13 1237  PROBNP 12469.0*    Signed:  Pamella Pert  [facility] 05/08/2013, 2:59 PM

## 2013-05-08 NOTE — Progress Notes (Signed)
Physical Therapy Treatment Patient Details Name: Caroline Schroeder MRN: [id number] DOB: 26-Oct-1920 Today's Date: 05/08/2013 Time: 4098-1191 PT Time Calculation (min): 45 min  PT Assessment / Plan / Recommendation  History of Present Illness Pt admit with CHF.     PT Comments   Pt admitted with above. Pt currently with functional limitations due to continued endurance and balance deficits.  Pt actually performed worse today secondary to right LE hematoma limiting weight bearing on her right LE.    Pt will benefit from skilled PT to increase their independence and safety with mobility to allow discharge to the venue listed below.   Follow Up Recommendations  SNF;Supervision/Assistance - 24 hour                 Equipment Recommendations  None recommended by PT        Frequency Min 3X/week   Progress towards PT Goals Progress towards PT goals: Progressing toward goals  Plan Current plan remains appropriate    Precautions / Restrictions Precautions Precautions: Fall Restrictions Weight Bearing Restrictions: No   Pertinent Vitals/Pain VSS, right LE  Pain - nursing aware    Mobility  Bed Mobility Bed Mobility: Supine to Sit;Sit to Supine Supine to Sit: HOB elevated;3: Mod assist Sit to Supine: 3: Mod assist;HOB flat Details for Bed Mobility Assistance: max verbal/tactile cues to complete task, maxA to bring hips to EOB and assist to elevate trunk Transfers Transfers: Sit to Stand;Stand to Sit;Stand Pivot Transfers Sit to Stand: 1: +2 Total assist;With upper extremity assist;From bed Sit to Stand: Patient Percentage: 60% Stand to Sit: 1: +2 Total assist;With upper extremity assist;With armrests;To chair/3-in-1 Stand to Sit: Patient Percentage: 50% Stand Pivot Transfers: 1: +2 Total assist Stand Pivot Transfers: Patient Percentage: 60% Details for Transfer Assistance: Needed assist and cues for sit to stand and stand to sit.  Flexed posture with incr time to achieve upright  posture.  Needed incr steadying assist initially upon standing as well.  Assisted pt to 3N1 initially upon standing. Pt not wanting to put weight on right LE secondary to pain due to hematoma.  Wound oozing as well.  Reinforced dressing.  Nursing notified and nursing made MD aware.  MD states NH can provide care that pt needs.   Ambulation/Gait Ambulation/Gait Assistance: Not tested (comment) Ambulation/Gait Assistance Details: Pt unable to ambulate due to right LE pain secondary to hematoma rightLE.   Stairs: No Wheelchair Mobility Wheelchair Mobility: No     PT Goals (current goals can now be found in the care plan section)    Visit Information  Last PT Received On: 05/08/13 Assistance Needed: +2 History of Present Illness: Pt admit with CHF.      Subjective Data  Subjective: "I dont want to get up."   Cognition  Cognition Arousal/Alertness: Awake/alert Behavior During Therapy: Flat affect Overall Cognitive Status: History of cognitive impairments - at baseline    Balance  Static Sitting Balance Static Sitting - Balance Support: Bilateral upper extremity supported Static Sitting - Level of Assistance: 5: Stand by assistance Static Sitting - Comment/# of Minutes: 3 Static Standing Balance Static Standing - Balance Support: Bilateral upper extremity supported;During functional activity Static Standing - Level of Assistance: 3: Mod assist Static Standing - Comment/# of Minutes: 3 min while being cleaned after getting off 3N1, flexed posture and needs steadying assist.    End of Session PT - End of Session Equipment Utilized During Treatment: Gait belt;Oxygen Activity Tolerance: Patient limited by fatigue Patient left: with  call bell/phone within reach;with family/visitor present;in chair (LEs elevated with HOB flat) Nurse Communication: Mobility status        INGOLD,Caroline Schroeder 05/08/2013, 10:10 AM [facility] [phone] (pager)

## 2013-05-08 NOTE — Clinical Social Work Note (Signed)
Clinical Social Worker facilitated patient discharge including contacting patient family and facility to confirm patient discharge plans.  Clinical information faxed to facility and family agreeable with plan.  Received Blue Medicare Authorization from [facility] for SNF placement and transport. CSW arranged ambulance transport via [facility] to [facility].  RN to call report prior to discharge.  Clinical Social Worker will sign off for now as social work intervention is no longer needed. Please consult [place] again if new need arises.  Macario Golds, [place] [phone]

## 2013-05-08 NOTE — Consult Note (Signed)
WOC wound consult note Refer to initial consult note on 10/31 for previous assessment and plan of care.  Requested to re-consult for right leg wound.  Previously, pt had bulla with old blood which ruptured and had mod amt dark red drainage. This was treated with foam dressing to protect and absorb drainage.  Several other sites of partial thickness wounds were present where other blisters had ruptured and drained mod amt yellow fluid.  Daughter at bedside states that wound was doing well until pt began ambulating several days ago, then right leg began swelling and pt had increasing pain to site. Wound type: Hematoma to right calf  Measurement: 12X12cm Wound bed:100% clotted blood underneath raised surface of skin.  No open wound which could benefit from topical treatment at this time.  Area firm to touch and painful all the time, pt states.   Drainage (amount, consistency, odor) This previously drained mod amt dark red blood when pt was ambulating, according to pt's daughter. Currently no drainage. Periwound: Dark purple surrounding wound. Dressing procedure/placement/frequency: Recommend surgical consult to assess for possible bedside debridement of hematoma to reduce pressure to affected area of leg.  Wound is high risk for old clotted blood to evolve into eschar. Discussed plan of care with primary physician, who plans to call surgical team. Please re-consult if further assistance is needed.  Thank-you,  Cammie Mcgee MSN, RN, CWOCN, Maish Vaya, CNS [phone]

## 2013-05-08 NOTE — Progress Notes (Signed)
Notified Wound care nurse of hematoma on right leg. Wound nurse will come assess and evaluate leg. Will continue to monitor.

## 2013-05-08 NOTE — Progress Notes (Signed)
Patient in lying in bed with head elevated. Noticed when patient taking medication, patient begin to chew the pills and was storing it in her cheek. Patient given applesauce with pills to help patient to swallow pills and was effective.  Dressing changed on right large hematoma. Foam gauze applied and wrapped with kerlex to help with drainage for patient may be leaving for SNF today. Will continue to monitor to end of shift.

## 2013-05-08 NOTE — Consult Note (Signed)
ORTHOPAEDIC CONSULTATION  REQUESTING PHYSICIAN: Costin Otelia Sergeant, MD  Chief Complaint: Left tibia contusion  HPI: Caroline Schroeder is a 79 y.o. female who complains of  Hematoma L tibia unknown origion  Past Medical History  Diagnosis Date  . Hypertension   . High cholesterol   . Essential thrombocythemia   . Onychia and paronychia of toe   . Other specified disease of white blood cells   . Abnormality of gait   . Hyperpotassemia   . Urinary frequency   . Alveolitis of jaw   . Osteoporosis, unspecified   . Hemorrhage of gastrointestinal tract, unspecified   . Osteoarthrosis, unspecified whether generalized or localized, unspecified site   . Memory loss   . Unspecified urinary incontinence   . CHF (congestive heart failure)   . Pulmonary embolism 12/2012    bilateral/notes 05/01/2013  . DVT (deep venous thrombosis) 12/2012    "left leg" (05/01/2013)  . Exertional shortness of breath     "just now" (05/01/2013)  . Dementia     "has had problems for last 10 yrs or so" (05/01/2013)  . Alzheimer's disease   . Dementia in conditions classified elsewhere without behavioral disturbance(294.10)   . Urinary incontinence    Past Surgical History  Procedure Laterality Date  . Appendectomy    . Cataract extraction w/ intraocular lens  implant, bilateral Bilateral ?2000's   History   Social History  . Marital Status: Widowed    Spouse Name: N/A    Number of Children: N/A  . Years of Education: N/A   Social History Main Topics  . Smoking status: Never Smoker   . Smokeless tobacco: Never Used  . Alcohol Use: Yes     Comment: 05/01/2013 "used to drink; rarely has mixed drink"  . Drug Use: No  . Sexual Activity: None   Other Topics Concern  . None   Social History Narrative  . None   Family History  Problem Relation Age of Onset  . Stroke Brother   . Cancer Mother     colon  . Heart attack Father    No Known Allergies Prior to Admission medications     Medication Sig Start Date End Date Taking? Authorizing Provider  aspirin EC 81 MG tablet Take 81 mg by mouth every evening.   Yes Historical Provider, MD  calcium-vitamin D (OSCAL WITH D) 500-200 MG-UNIT per tablet Take 1 tablet by mouth 2 (two) times daily.   Yes Historical Provider, MD  cholecalciferol (VITAMIN D) 1000 UNITS tablet Take 1,000 Units by mouth daily.   Yes Historical Provider, MD  divalproex (DEPAKOTE SPRINKLE) 125 MG capsule Take 125 mg by mouth daily.   Yes Historical Provider, MD  donepezil (ARICEPT) 10 MG tablet Take 10 mg by mouth at bedtime.   Yes Historical Provider, MD  ferrous sulfate 325 (65 FE) MG tablet Take 1 tablet (325 mg total) by mouth daily with breakfast. 04/23/13  Yes Claudie Revering, NP  memantine (NAMENDA) 10 MG tablet Take 10 mg by mouth 2 (two) times daily.   Yes Historical Provider, MD  Multiple Vitamin (MULTIVITAMIN WITH MINERALS) TABS Take 1 tablet by mouth daily.   Yes Historical Provider, MD  doxycycline (VIBRAMYCIN) 50 MG capsule Take 2 capsules (100 mg total) by mouth 2 (two) times daily. 05/06/13   Marinda Elk, MD  furosemide (LASIX) 40 MG tablet Take 1 tablet (40 mg total) by mouth daily. 05/07/13   Marinda Elk, MD  metoprolol  tartrate (LOPRESSOR) 25 MG tablet Take 0.5 tablets (12.5 mg total) by mouth 2 (two) times daily. 05/08/13   Costin Otelia Sergeant, MD   No results found.  Positive ROS: All other systems have been reviewed and were otherwise negative with the exception of those mentioned in the HPI and as above.  Labs cbc No results found for this basename: WBC, HGB, HCT, PLT,  in the last 72 hours  Labs inflam No results found for this basename: ESR, CRP,  in the last 72 hours  Labs coag No results found for this basename: INR, PT, PTT,  in the last 72 hours   Recent Labs  05/07/13 0324 05/08/13 0533  NA 139 139  K 3.3* 3.2*  CL 94* 94*  CO2 37* 36*  GLUCOSE 117* 92  BUN 31* 27*  CREATININE 1.73* 1.40*  CALCIUM  9.1 8.8    Physical Exam: Filed Vitals:   05/08/13 1100  BP: 100/60  Pulse: 89  Temp:   Resp:    General: Alert, no acute distress Cardiovascular: No pedal edema Respiratory: No cyanosis, no use of accessory musculature GI: No organomegaly, abdomen is soft and non-tender Skin: No lesions in the area of chief complaint Neurologic: Sensation intact distally Psychiatric: Patient is competent for consent with normal mood and affect Lymphatic: No axillary or cervical lymphadenopathy  MUSCULOSKELETAL:  SILT DP/SP/S/S/T nerve, 2+ DP, +TA/GS/EHL Compartments soft Painless ROM No Crepitous Large hematoma with some serosangunous drainage, no purulence, some surrounding erythema, no warmth Other extremities are atraumatic with painless ROM and NVI.  Assessment: Pretibial hematoma, question reactive erythema vs cellulitis  Plan: Compressive dressing Weight Bearing Status: WBAT Recommend PO abx as outpatient for possible cellulitis PT VTE px: SCD's and chemical per primary F/u with me in 2wks for recheck.    Margarita Rana, D, MD Cell [phone]   05/08/2013 12:45 PM

## 2013-05-08 NOTE — Progress Notes (Signed)
Report given to nurse at [facility]. Patient transferred to SNF now and will continue further care.

## 2013-05-12 ENCOUNTER — Ambulatory Visit: Payer: Medicare Other | Admitting: Internal Medicine

## 2013-05-27 ENCOUNTER — Encounter (HOSPITAL_BASED_OUTPATIENT_CLINIC_OR_DEPARTMENT_OTHER): Payer: Medicare Other | Attending: General Surgery

## 2013-05-27 DIAGNOSIS — IMO0002 Reserved for concepts with insufficient information to code with codable children: Secondary | ICD-10-CM | POA: Insufficient documentation

## 2013-05-27 DIAGNOSIS — F0390 Unspecified dementia without behavioral disturbance: Secondary | ICD-10-CM | POA: Diagnosis not present

## 2013-05-27 DIAGNOSIS — S81009A Unspecified open wound, unspecified knee, initial encounter: Secondary | ICD-10-CM | POA: Insufficient documentation

## 2013-05-28 NOTE — H&P (Signed)
Caroline Schroeder, TAKACS NO.:  [id number]  MEDICAL RECORD NO.:  [id number]  LOCATION:  FOOT                         FACILITY:  [facility]  PHYSICIAN:  Joanne Gavel, M.D.        DATE OF BIRTH:  04-17-1921  DATE OF ADMISSION:  05/27/2013 DATE OF DISCHARGE:                             HISTORY & PHYSICAL   CHIEF COMPLAINT:  Wound, right leg.  HISTORY OF PRESENT ILLNESS:  This is a 79 year old female in a complete state of dementia.  She does not communicate.  She is accompanied by an aide who is not really aware of the patient's history.  It seems like approximately 1 month ago, she banged her leg, developed a sizable hematoma, and this has resulted in a sizable skin loss.  PAST MEDICAL HISTORY:  Significant for aortic valve disorder, atrial fibrillation, GI bleed, cellulitis, hypercholesterolemia, congestive heart failure, dyspnea, and edema right lower extremity.  ALLERGIES:  None.  MEDICATIONS:  Aspirin, calcium with vitamin D, Depakote, Aricept, furosemide, metoprolol, Xarelto, she is taking doxycycline at the present time.  CIGARETTES AND ALCOHOL:  None.  REVIEW OF SYSTEMS:  That is all it is obtainable.  PHYSICAL EXAMINATION:  VITAL SIGNS:  Temperature 98, pulse 140 and irregularly irregular, respirations 16, blood pressure 106/73. GENERAL:  Very thin.  The patient is completely uncommunicative. CHEST:  Clear. HEART:  Irregularly irregular with a precordial murmur in systole. ABDOMEN:  Not examined. EXTREMITIES:  Examination of the right lower extremity reveals 11.5 x 7.0 superficial wound with overhanging edges and very thick adherent slough.  Peripheral pulses are not palpable, but general skin nutrition seems adequate.  ABIs measured at 1.26.  The patient has bruises on both lower extremities.  IMPRESSION:  Open wound status post large hematoma.  PLAN OF TREATMENT:  We will start with Hydrogel and MEDIHONEY.  She will require either skin grafting or  biological skin substitutes.  We will see her in 7 days.     Joanne Gavel, M.D.     RA/MEDQ  D:  05/27/2013  T:  05/28/2013  Job:  [id number]

## 2013-06-03 ENCOUNTER — Encounter (HOSPITAL_BASED_OUTPATIENT_CLINIC_OR_DEPARTMENT_OTHER): Payer: Medicare Other | Attending: General Surgery

## 2013-06-03 DIAGNOSIS — S81009A Unspecified open wound, unspecified knee, initial encounter: Secondary | ICD-10-CM | POA: Insufficient documentation

## 2013-06-03 DIAGNOSIS — IMO0002 Reserved for concepts with insufficient information to code with codable children: Secondary | ICD-10-CM | POA: Insufficient documentation

## 2013-08-31 DEATH — deceased

## 2014-02-15 IMAGING — CT CT HEAD W/O CM
2 series · 16 of 30 positions shown, 18 images · non-contrast
Comparison: 12/05/2012.

CLINICAL DATA: Diabetic hypertensive with hyperlipidemia.  Fall.
Headache.

CT HEAD WITHOUT CONTRAST
TECHNIQUE: Contiguous axial images were obtained from the base of
the skull through the vertex without contrast.

[Series 2: head w/o · axial · non-contrast · 0.49mm/px · z∈[+30,+142]mm · 8 of 28 slices shown, 10 images]
[im 4/28  brain]
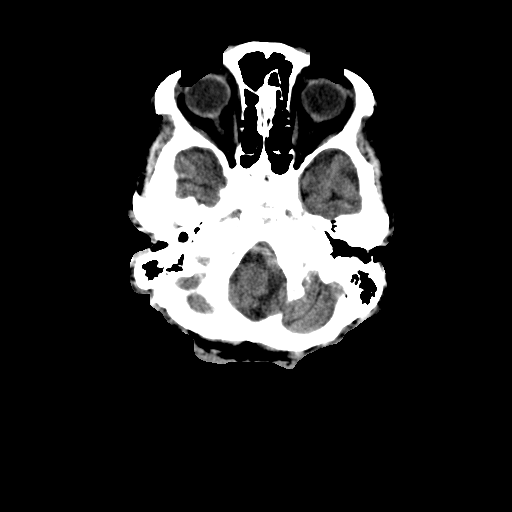
[im 4/28  bone]
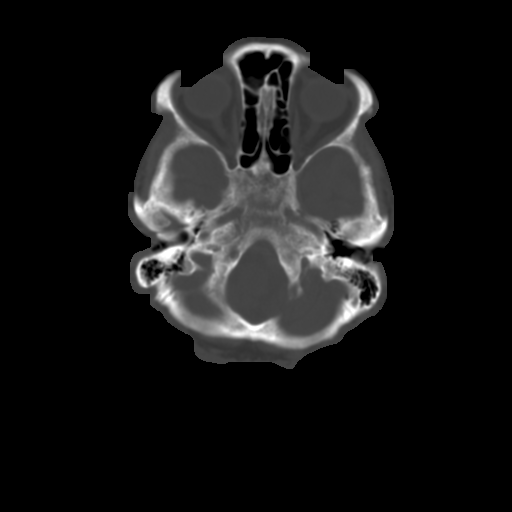
[im 7/28  brain]
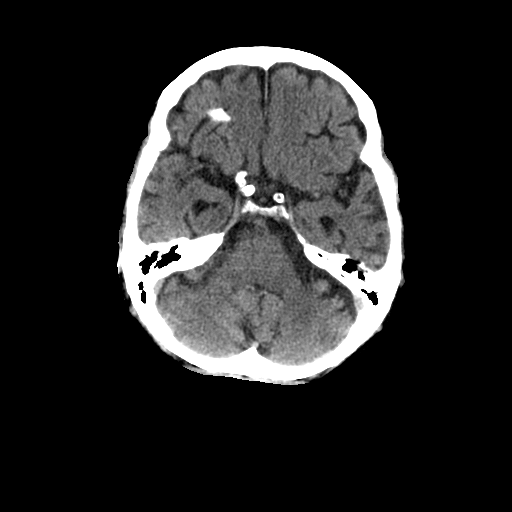
[im 10/28  brain]
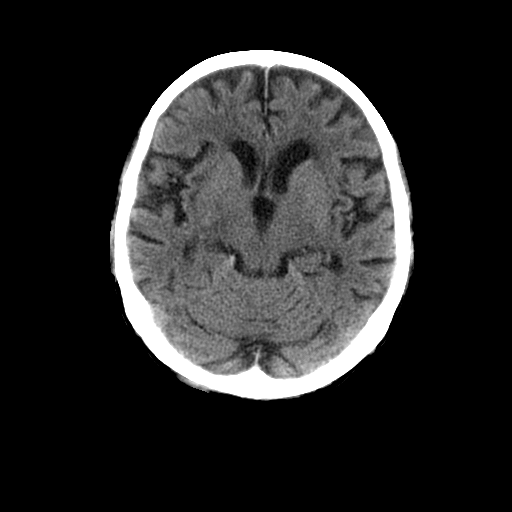
[im 13/28  brain]
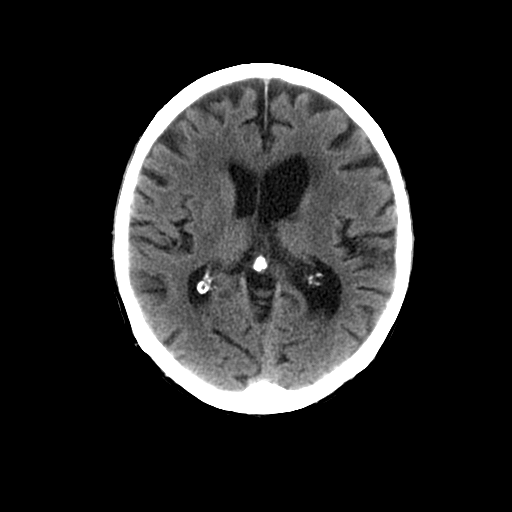
[im 16/28  brain]
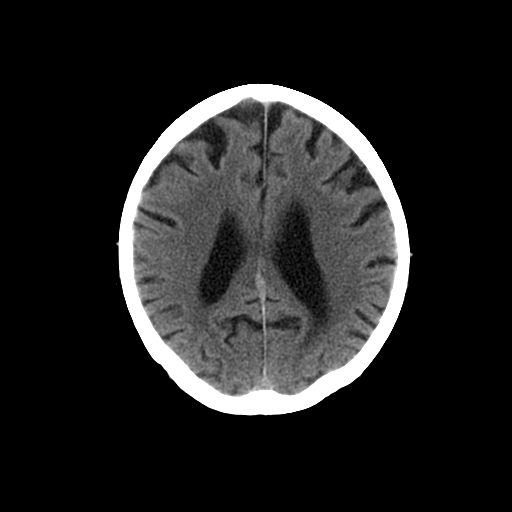
[im 16/28  bone]
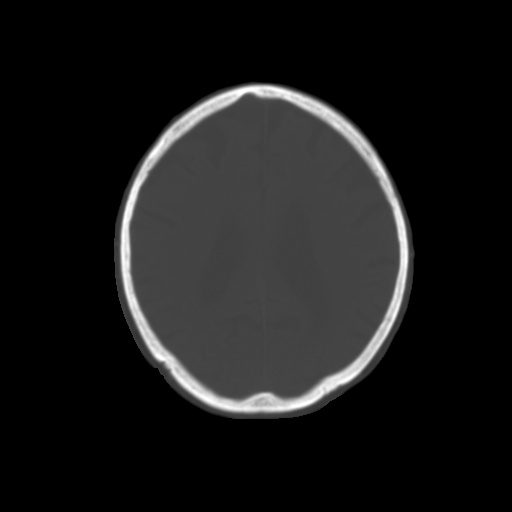
[im 19/28  brain]
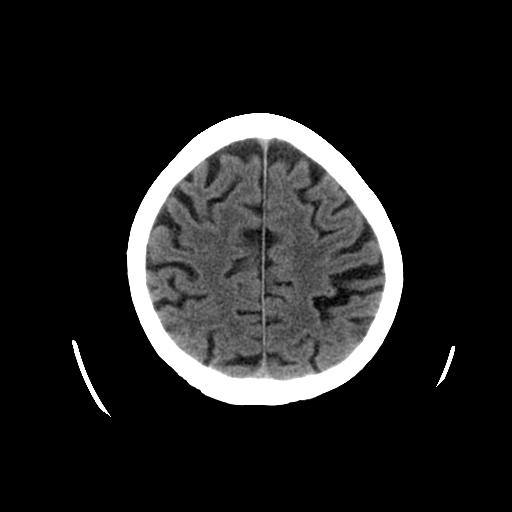
[im 22/28  brain]
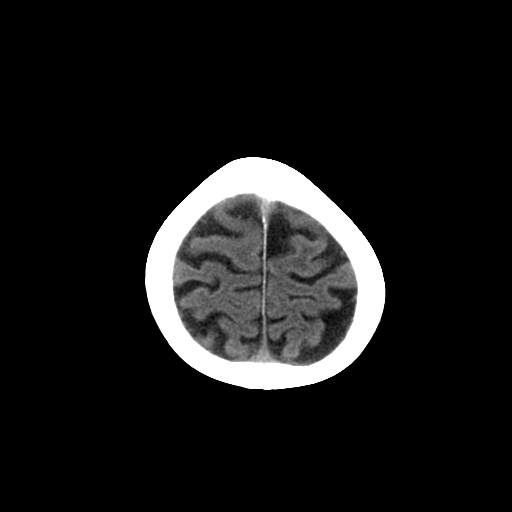
[im 25/28  brain]
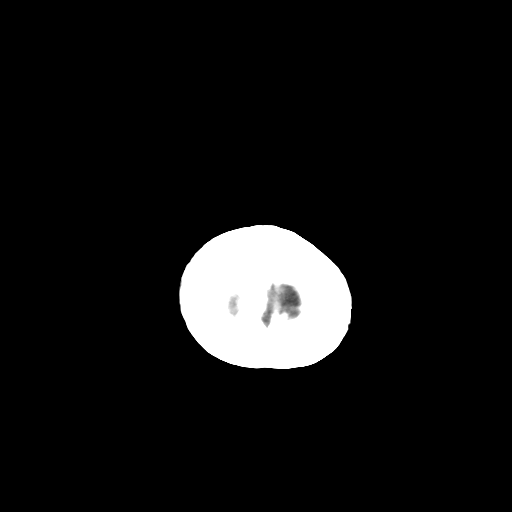

[Series 3: head bone · axial · 0.49mm/px · z∈[+26,+135]mm · 8 of 56 slices shown]
[im 6/56  bone]
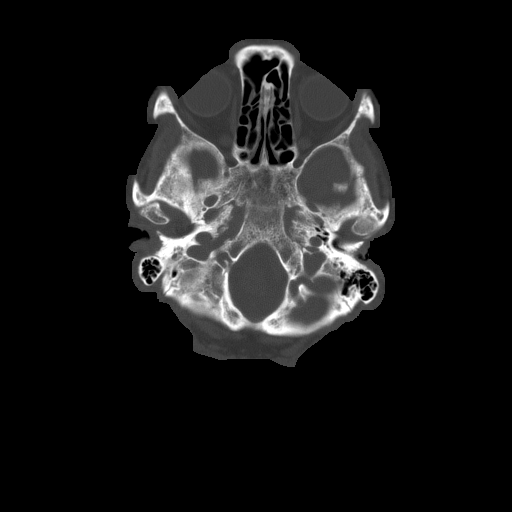
[im 12/56  bone]
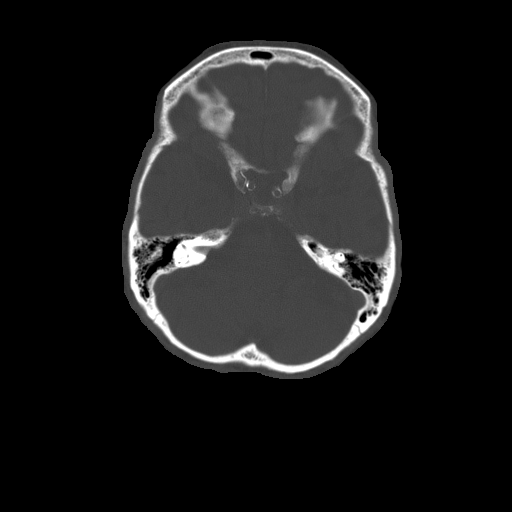
[im 18/56  bone]
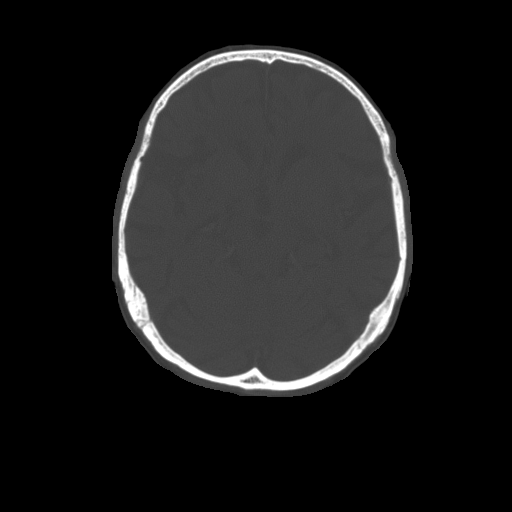
[im 24/56  bone]
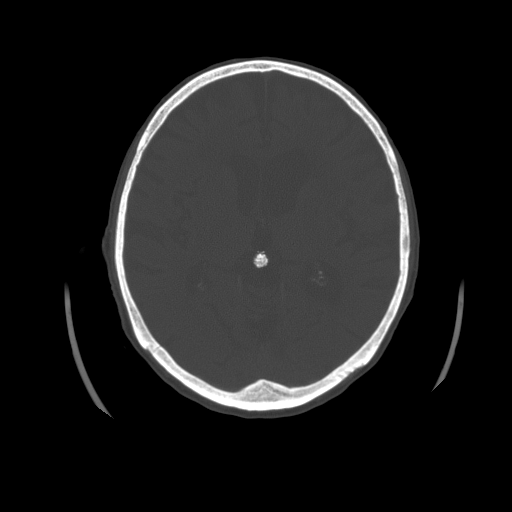
[im 29/56  bone]
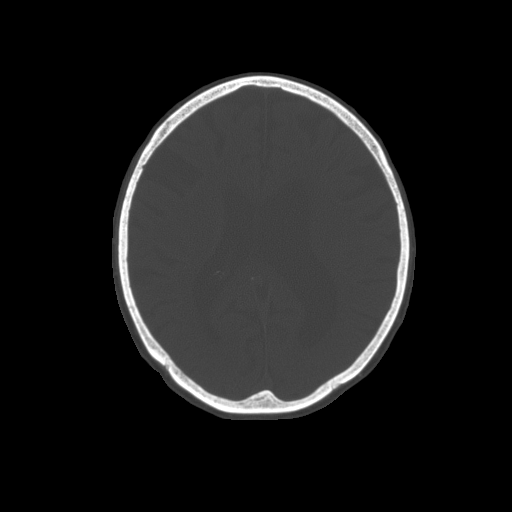
[im 35/56  bone]
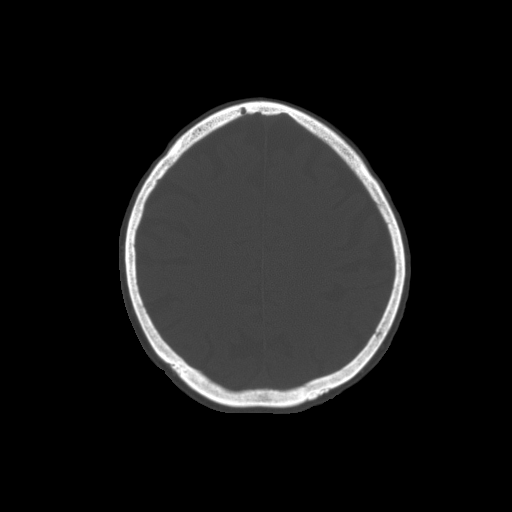
[im 41/56  bone]
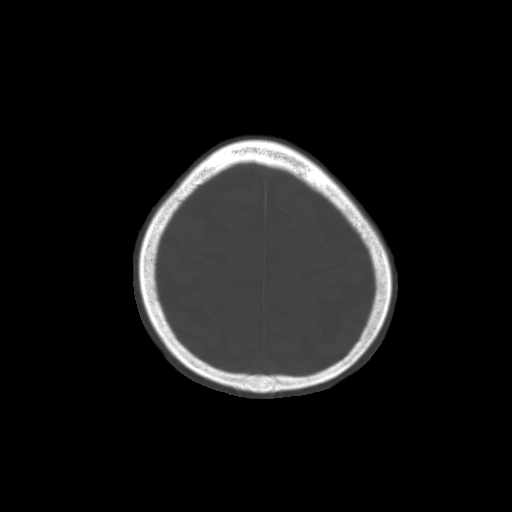
[im 47/56  bone]
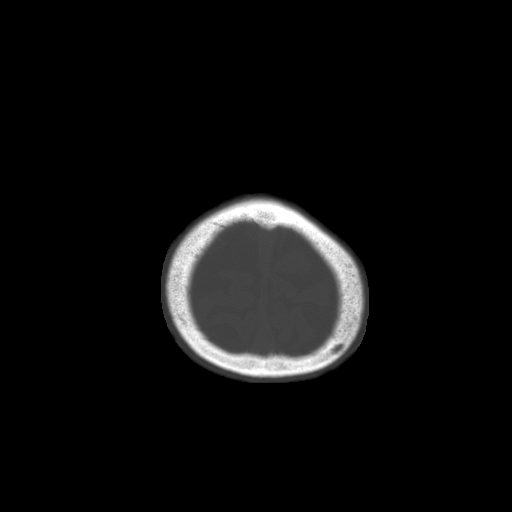

[16 of 30 positions shown; findings below may reference images not displayed]

FINDINGS: No skull fracture or intracranial hemorrhage.

Global atrophy with ventricular prominence probably related to
atrophy rather than hydrocephalus and without change.

Small vessel disease type changes without CT evidence of large
acute infarct.

No intracranial mass lesion detected on this unenhanced exam..

Vascular calcifications.

Mastoid air cells, middle ear cavities and visualized paranasal
sinuses are clear.
IMPRESSION: No skull fracture or intracranial hemorrhage.

Small vessel disease type changes without CT evidence of large
acute infarct.

This is a call report..

## 2014-06-28 IMAGING — CR DG CHEST 2V
1 series · 1 of 1 positions shown · non-contrast
Comparison: CHEST x-ray 12/04/2012. Chest CT 12/04/2012.

CLINICAL DATA: Shortness of breath. History of pulmonary embolism.
Swollen legs. Deep venous thrombosis.

EXAM:
CHEST  2 VIEW

[w chest lat]
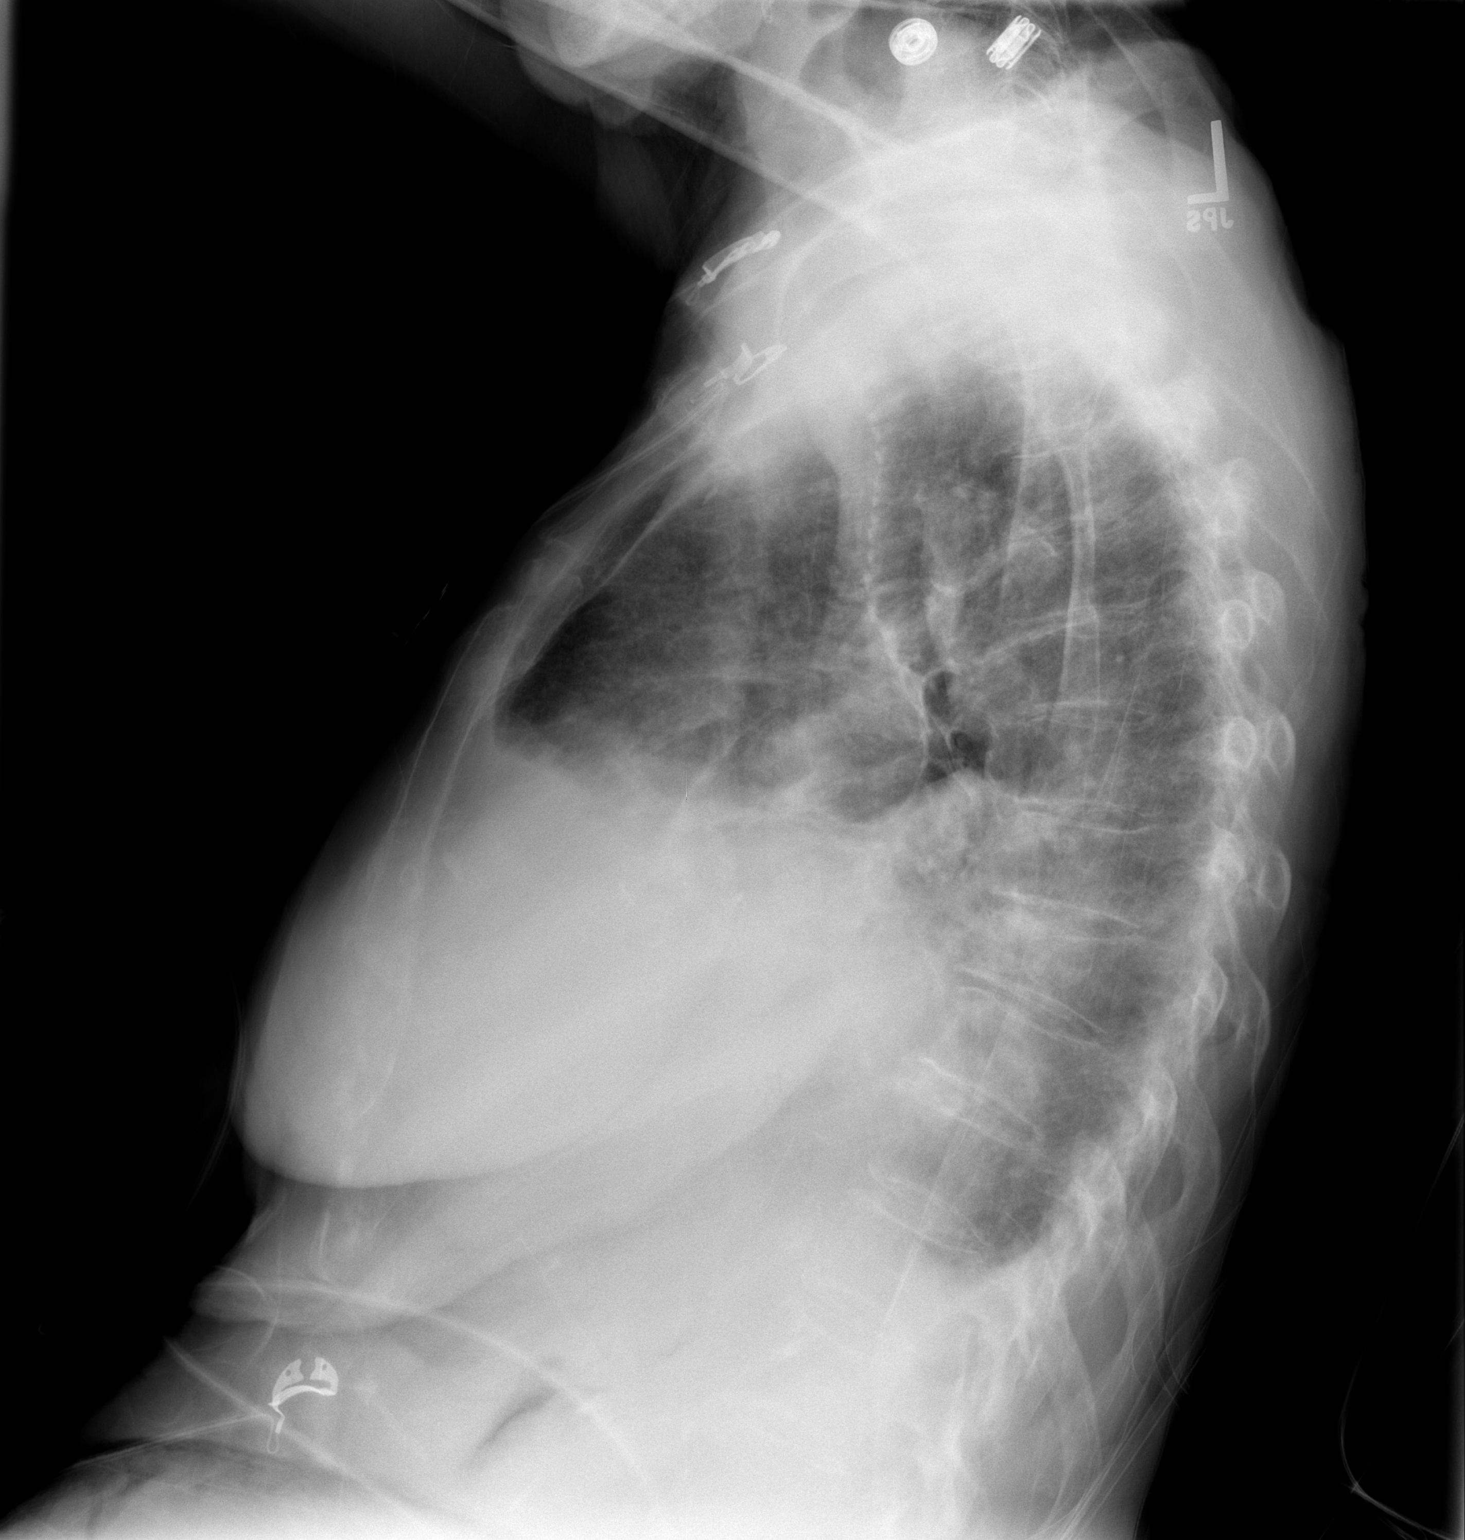

[1 of 1 positions shown; findings below may reference images not displayed]

FINDINGS: Bibasilar opacities may reflect areas of atelectasis and/or
consolidation. Moderate right and small left effusion. Peripheral
ill-defined opacity in the left mid lung. Cephalization of the
pulmonary vasculature, without frank pulmonary edema. Mild
cardiomegaly. Upper mediastinal contours are unremarkable.
Atherosclerosis in the thoracic aorta. Old compression fracture of a
mid thoracic vertebral body (likely T8), unchanged.
IMPRESSION: 1. Bibasilar areas of atelectasis and/or consolidation. In the
appropriate clinical setting, this could be related to sequela of
aspiration, developing infection, or multifocal pulmonary
infarctions. Clinical correlation is recommended. Additionally,
there is some ill-defined airspace disease in the left mid lung.
2. Moderate right and small left pleural effusions.
3. Cardiomegaly with pulmonary venous congestion, but no frank
pulmonary edema.
4. Atherosclerosis.

## 2014-12-28 ENCOUNTER — Other Ambulatory Visit: Payer: Self-pay
